# Patient Record
Sex: Female | Born: 1965
Health system: Southern US, Community
[De-identification: ages and names within clinical notes are randomized; demographics above are authoritative.]

## PROBLEM LIST (undated history)

## (undated) DIAGNOSIS — F32A Depression, unspecified: Secondary | ICD-10-CM

## (undated) DIAGNOSIS — F329 Major depressive disorder, single episode, unspecified: Secondary | ICD-10-CM

## (undated) DIAGNOSIS — K2970 Gastritis, unspecified, without bleeding: Secondary | ICD-10-CM

## (undated) DIAGNOSIS — E119 Type 2 diabetes mellitus without complications: Secondary | ICD-10-CM

## (undated) DIAGNOSIS — N809 Endometriosis, unspecified: Secondary | ICD-10-CM

## (undated) HISTORY — DX: Endometriosis, unspecified: N80.9

## (undated) HISTORY — DX: Depression, unspecified: F32.A

## (undated) HISTORY — PX: TONSILLECTOMY: SUR1361

## (undated) HISTORY — DX: Type 2 diabetes mellitus without complications: E11.9

## (undated) HISTORY — PX: REDUCTION MAMMAPLASTY: SUR839

## (undated) HISTORY — DX: Gastritis, unspecified, without bleeding: K29.70

## (undated) HISTORY — DX: Major depressive disorder, single episode, unspecified: F32.9

---

## 1994-02-12 HISTORY — PX: AUGMENTATION MAMMAPLASTY: SUR837

## 1998-02-26 ENCOUNTER — Encounter: Payer: Self-pay | Admitting: Emergency Medicine

## 1998-02-27 ENCOUNTER — Inpatient Hospital Stay (HOSPITAL_COMMUNITY): Admission: EM | Admit: 1998-02-27 | Discharge: 1998-02-28 | Payer: Self-pay | Admitting: Emergency Medicine

## 1998-02-27 ENCOUNTER — Encounter: Payer: Self-pay | Admitting: General Surgery

## 1998-02-27 ENCOUNTER — Encounter: Payer: Self-pay | Admitting: Family Medicine

## 1998-02-27 ENCOUNTER — Encounter: Payer: Self-pay | Admitting: Emergency Medicine

## 1998-03-09 ENCOUNTER — Ambulatory Visit (HOSPITAL_COMMUNITY): Admission: RE | Admit: 1998-03-09 | Discharge: 1998-03-09 | Payer: Self-pay | Admitting: Gastroenterology

## 1998-03-09 ENCOUNTER — Encounter: Payer: Self-pay | Admitting: Gastroenterology

## 1998-03-15 HISTORY — PX: ABDOMINAL HYSTERECTOMY: SHX81

## 1998-03-25 ENCOUNTER — Inpatient Hospital Stay (HOSPITAL_COMMUNITY): Admission: RE | Admit: 1998-03-25 | Discharge: 1998-03-28 | Payer: Self-pay | Admitting: Obstetrics and Gynecology

## 1998-03-30 ENCOUNTER — Inpatient Hospital Stay (HOSPITAL_COMMUNITY): Admission: AD | Admit: 1998-03-30 | Discharge: 1998-03-30 | Payer: Self-pay | Admitting: Gynecology

## 1998-04-15 ENCOUNTER — Encounter (HOSPITAL_COMMUNITY): Admission: RE | Admit: 1998-04-15 | Discharge: 1998-07-14 | Payer: Self-pay | Admitting: Obstetrics and Gynecology

## 1998-09-16 ENCOUNTER — Other Ambulatory Visit: Admission: RE | Admit: 1998-09-16 | Discharge: 1998-09-16 | Payer: Self-pay | Admitting: Obstetrics and Gynecology

## 2002-11-16 ENCOUNTER — Encounter: Admission: RE | Admit: 2002-11-16 | Discharge: 2003-02-14 | Payer: Self-pay | Admitting: Obstetrics and Gynecology

## 2005-09-18 ENCOUNTER — Encounter: Admission: RE | Admit: 2005-09-18 | Discharge: 2005-10-11 | Payer: Self-pay | Admitting: Family Medicine

## 2010-03-05 ENCOUNTER — Encounter: Payer: Self-pay | Admitting: Family Medicine

## 2011-09-25 ENCOUNTER — Emergency Department (HOSPITAL_COMMUNITY)
Admission: EM | Admit: 2011-09-25 | Discharge: 2011-09-25 | Disposition: A | Discharge status: Home / Self Care | Payer: BC Managed Care – PPO | Attending: Emergency Medicine | Admitting: Emergency Medicine

## 2011-09-25 ENCOUNTER — Encounter (HOSPITAL_COMMUNITY): Payer: Self-pay | Admitting: Emergency Medicine

## 2011-09-25 DIAGNOSIS — H60399 Other infective otitis externa, unspecified ear: Secondary | ICD-10-CM | POA: Insufficient documentation

## 2011-09-25 DIAGNOSIS — H6090 Unspecified otitis externa, unspecified ear: Secondary | ICD-10-CM

## 2011-09-25 MED ORDER — CIPROFLOXACIN-DEXAMETHASONE 0.3-0.1 % OT SUSP
4.0000 [drp] | Freq: Once | OTIC | Status: AC
  Administered 2011-09-25: 4 [drp] via OTIC
  Filled 2011-09-25: qty 7.5

## 2011-09-25 NOTE — ED Provider Notes (Signed)
History     CSN: [id number]  Arrival date & time 09/25/11  4098   First MD Initiated Contact with Patient 09/25/11 0450      Chief Complaint  Patient presents with  . Otalgia  . Headache   HPI  History provided by the patient. Patient is a 46 year old female with no significant PMH who presents with complaints of right ear pains for the past several days. Patient states that pain has increased significantly and is worse with any pressure or movement of her ear. Pain radiates to the whole side of the right head. Patient has been trying to use over-the-counter pain medications without significant relief. She denies having any drainage from the ear. She denies any hearing loss or ringing in the ear. She denies any nasal congestion, rhinorrhea or cough. She denies any fever, chills or sweats.    History reviewed. No pertinent past medical history.  Past Surgical History  Procedure Date  . Abdominal hysterectomy   . C section x 2     History reviewed. No pertinent family history.  History  Substance Use Topics  . Smoking status: Never Smoker   . Smokeless tobacco: Not on file  . Alcohol Use: No    OB History    Grav Para Term Preterm Abortions TAB SAB Ect Mult Living                  Review of Systems  Constitutional: Negative for fever and chills.  HENT: Positive for ear pain. Negative for hearing loss, congestion, sore throat, rhinorrhea and tinnitus.   Respiratory: Negative for cough.   Gastrointestinal: Negative for nausea and vomiting.    Allergies  Hydrocodone and Sulfa antibiotics  Home Medications   Current Outpatient Rx  Name Route Sig Dispense Refill  . AMPHETAMINE-DEXTROAMPHETAMINE 20 MG PO TABS Oral Take 20 mg by mouth 2 (two) times daily.    . BUPROPION HCL ER (XL) 300 MG PO TB24 Oral Take 300 mg by mouth daily.    . CYCLOBENZAPRINE HCL 10 MG PO TABS Oral Take 10 mg by mouth 3 (three) times daily as needed. Neck and shoulder pain    .  LEVONORGESTREL-ETHINYL ESTRAD 0.1-20 MG-MCG PO TABS Oral Take 1 tablet by mouth daily.    Marland Kitchen NABUMETONE 500 MG PO TABS Oral Take 500 mg by mouth 2 (two) times daily as needed. Ulcer    . TEMAZEPAM 30 MG PO CAPS Oral Take 30 mg by mouth at bedtime as needed.      BP 146/85  Pulse 88  Temp 98.2 F (36.8 C) (Oral)  Resp 16  SpO2 100%  Physical Exam  Nursing note and vitals reviewed. Constitutional: She is oriented to person, place, and time. She appears well-developed and well-nourished. No distress.  HENT:  Head: Normocephalic and atraumatic.       There is edema right ear canal making it difficult to visualize TM. No significant drainage noted. There is tenderness to the ear special with pressure over the tragus.  Cardiovascular: Normal rate and regular rhythm.   Pulmonary/Chest: Effort normal and breath sounds normal.  Lymphadenopathy:    She has no cervical adenopathy.  Neurological: She is alert and oriented to person, place, and time.  Skin: Skin is warm and dry. No rash noted.  Psychiatric: She has a normal mood and affect. Her behavior is normal.    ED Course  Procedures    1. Otitis externa       MDM  4:00  AM patient seen and evaluated. Exam consistent with otitis externa. Patient given Ciprodex drops to use at home.        Angus Seller, [place] 09/27/11 [phone]

## 2011-09-25 NOTE — ED Notes (Signed)
Pt states her right ear has been closing up and bothering her off and on for about a week  Pt states the right side of her neck feels swollen and it is causing shooting pain in the right side of her head   Pt states it has gotten much worse this evening

## 2011-09-27 NOTE — ED Provider Notes (Signed)
Medical screening examination/treatment/procedure(s) were performed by non-physician practitioner and as supervising physician I was immediately available for consultation/collaboration.  Olivia Mackie, MD 09/27/11 [phone]

## 2012-05-16 ENCOUNTER — Encounter: Payer: Self-pay | Admitting: Obstetrics and Gynecology

## 2012-05-21 ENCOUNTER — Ambulatory Visit: Payer: Self-pay | Admitting: Obstetrics and Gynecology

## 2012-09-03 ENCOUNTER — Encounter: Payer: Self-pay | Admitting: Obstetrics and Gynecology

## 2012-09-08 ENCOUNTER — Ambulatory Visit: Payer: Self-pay | Admitting: Obstetrics and Gynecology

## 2012-09-15 ENCOUNTER — Ambulatory Visit: Payer: Self-pay | Admitting: Obstetrics and Gynecology

## 2012-12-25 ENCOUNTER — Encounter: Payer: Self-pay | Admitting: Nurse Practitioner

## 2012-12-25 ENCOUNTER — Ambulatory Visit (INDEPENDENT_AMBULATORY_CARE_PROVIDER_SITE_OTHER): Payer: BC Managed Care – PPO | Admitting: Nurse Practitioner

## 2012-12-25 VITALS — BP 112/60 | HR 80 | Resp 16 | Ht 64.0 in | Wt 196.0 lb

## 2012-12-25 DIAGNOSIS — N809 Endometriosis, unspecified: Secondary | ICD-10-CM | POA: Insufficient documentation

## 2012-12-25 DIAGNOSIS — N83209 Unspecified ovarian cyst, unspecified side: Secondary | ICD-10-CM

## 2012-12-25 DIAGNOSIS — Z01419 Encounter for gynecological examination (general) (routine) without abnormal findings: Secondary | ICD-10-CM

## 2012-12-25 MED ORDER — FLUCONAZOLE 150 MG PO TABS: 150.0000 mg | ORAL_TABLET | Freq: Once | ORAL | Status: DC

## 2012-12-25 NOTE — Patient Instructions (Signed)

## 2012-12-25 NOTE — Progress Notes (Signed)
47 y.o. G2P2 Married Caucasian Fe here for annual exam.  Has been off OCP since January.  No pain but can tell when she ovulates. She is post TAH/ LSO with right OV cystectomy secondary to endometriosis.  She has had to take OCP to reduce ovarian cyst through the years.  Has tried several times to stop and always has recurrent pain.  At this time she has done well without pain.  Her last PUS 07/2008 with small cyst on right and maybe mass effect on left from endometriosis.   Every time off OCP pain was worse, except this time. Some vaso symptoms.  Sleep is OK.  No pelvic with SA.  No headaches.  Daughter (pt. here) got married last week.  Patient's last menstrual period was 03/15/1998.          Sexually active: yes  The current method of family planning is status post hysterectomy.    Exercising: no  The patient does not participate in regular exercise at present. Smoker:  no  Health Maintenance: Pap:  09/16/1998 MMG:  2/13 Colonoscopy:  none BMD:   none TDaP:  Not sure  Labs: PCP    reports that she has never smoked. She does not have any smokeless tobacco history on file. She reports that she does not drink alcohol or use illicit drugs.  Past Medical History  Diagnosis Date  . Depression     Past Surgical History  Procedure Laterality Date  . C section x 2    . Augmentation mammaplasty Bilateral 1996    breast lift  . Cesarean section      x 2  . Abdominal hysterectomy  03/1998    LSO,RT. Cystectomy    Current Outpatient Prescriptions  Medication Sig Dispense Refill  . cyclobenzaprine (FLEXERIL) 10 MG tablet Take 10 mg by mouth 3 (three) times daily as needed. Neck and shoulder pain      . nabumetone (RELAFEN) 500 MG tablet Take 500 mg by mouth 2 (two) times daily as needed. Ulcer      . temazepam (RESTORIL) 30 MG capsule Take 30 mg by mouth at bedtime as needed.      . fluconazole (DIFLUCAN) 150 MG tablet Take 1 tablet (150 mg total) by mouth once. Take one tablet.  Repeat in 48  hours if symptoms are not completely resolved.  2 tablet  0   No current facility-administered medications for this visit.    History reviewed. No pertinent family history.  ROS:  Pertinent items are noted in HPI.  Otherwise, a comprehensive ROS was negative.  Exam:   BP 112/60  Pulse 80  Resp 16  Ht 5\' 4"  (1.626 m)  Wt 196 lb (88.905 kg)  BMI 33.63 kg/m2  LMP 03/15/1998 Height: 5\' 4"  (162.6 cm)  Ht Readings from Last 3 Encounters:  12/25/12 5\' 4"  (1.626 m)  05/16/12 5' 3.5" (1.613 m)    General appearance: alert, cooperative and appears stated age Head: Normocephalic, without obvious abnormality, atraumatic Neck: no adenopathy, supple, symmetrical, trachea midline and thyroid normal to inspection and palpation Lungs: clear to auscultation bilaterally Breasts: normal appearance, no masses or tenderness Heart: regular rate and rhythm Abdomen: soft, non-tender; no masses,  no organomegaly Extremities: extremities normal, atraumatic, no cyanosis or edema Skin: Skin color, texture, turgor normal. No rashes or lesions Lymph nodes: Cervical, supraclavicular, and axillary nodes normal. No abnormal inguinal nodes palpated Neurologic: Grossly normal   Pelvic: External genitalia:  no lesions  Urethra:  normal appearing urethra with no masses, tenderness or lesions              Bartholin's and Skene's: normal                 Vagina: normal appearing vagina with normal color and some white discharge c/w yeast, no lesions              Cervix: absent              Pap taken: no Bimanual Exam:  Uterus:  uterus absent              Adnexa: no mass, fullness, tenderness               Rectovaginal: Confirms               Anus:  normal sphincter tone, no lesions  A:  Well Woman with normal exam  S/P TAH / LSO, right OV cystectomy secondary to endometriosis 03/1998  Last PUS 07/2008  History of depression  Yeast vaginitis  P:   Pap smear as per guidelines   Mammogram is due  now and will schedule  Will repeat PUS secondary to being off OCP to check ovarian cyst  Counseled on breast self exam, adequate intake of calcium and vitamin D, diet and exercise return annually or prn  An After Visit Summary was printed and given to the patient.

## 2012-12-26 ENCOUNTER — Telehealth: Payer: Self-pay | Admitting: Nurse Practitioner

## 2012-12-26 NOTE — Telephone Encounter (Signed)
LMTCB to discuss ins benefits and to schedule a PUS.

## 2012-12-28 NOTE — Progress Notes (Signed)
Encounter reviewed by Dr. Kenesha Moshier Silva.  

## 2012-12-30 NOTE — Telephone Encounter (Signed)
LMTCB to discuss ins benefits and schedule PUS.  °

## 2013-01-15 ENCOUNTER — Other Ambulatory Visit: Payer: BC Managed Care – PPO

## 2013-01-15 ENCOUNTER — Encounter: Payer: Self-pay | Admitting: Obstetrics and Gynecology

## 2013-01-15 ENCOUNTER — Ambulatory Visit (INDEPENDENT_AMBULATORY_CARE_PROVIDER_SITE_OTHER): Payer: BC Managed Care – PPO

## 2013-01-15 ENCOUNTER — Other Ambulatory Visit: Payer: BC Managed Care – PPO | Admitting: Obstetrics and Gynecology

## 2013-01-15 ENCOUNTER — Ambulatory Visit (INDEPENDENT_AMBULATORY_CARE_PROVIDER_SITE_OTHER): Payer: BC Managed Care – PPO | Admitting: Obstetrics and Gynecology

## 2013-01-15 VITALS — BP 122/88 | HR 88 | Ht 64.0 in | Wt 198.0 lb

## 2013-01-15 DIAGNOSIS — N83209 Unspecified ovarian cyst, unspecified side: Secondary | ICD-10-CM

## 2013-01-15 DIAGNOSIS — Z1239 Encounter for other screening for malignant neoplasm of breast: Secondary | ICD-10-CM

## 2013-01-15 DIAGNOSIS — N83201 Unspecified ovarian cyst, right side: Secondary | ICD-10-CM

## 2013-01-15 MED ORDER — NORETHIN-ETH ESTRAD-FE BIPHAS 1 MG-10 MCG / 10 MCG PO TABS: 1.0000 | ORAL_TABLET | Freq: Every day | ORAL | Status: DC

## 2013-01-15 NOTE — Progress Notes (Signed)
Subjective  GYNECOLOGY VISIT  PCP:   Referring provider:   HPI: 47 y.o.   Married  Caucasian  female   G2P2 with Patient's last menstrual period was 03/15/1998.   here for   Pelvic ultrasound due to pelvic pain and history of ovarian cysts. 15 years ago had total abdominal hysterectomy and left salpingo-oophorectomy and right ovarian cystectomy. Had Lupron therapy.  Has been ov oral contraceptives to control pain and ovarian cysts.  Does this off and on.  "Feels dead on oral contraceptives."  Does not feel well on them.  Gets depressed.  Unable to sleep when taking them.  Now off Alesse for 11 months.  Has never tried ultralow dose OCPs.  Patient states pain after her last pelvic exam.  When off OCPs has painful bowel movements.  Pain recently was more prominent when patient would stand up.  No hot flashes or night sweats.   Patient's last ultrasound was in 2010.  Saw 3.4 x 2.2 x 2.3 right ovary with several small lucent areas with low level echoes.  Left adnexal region with 9 x 7 x 8 mm area of possible endometriosis.    OB History   Grav Para Term Preterm Abortions TAB SAB Ect Mult Living   2 2        2        History reviewed. No pertinent family history.  Patient Active Problem List   Diagnosis Date Noted  . Endometriosis 12/25/2012   Past Medical History  Diagnosis Date  . Depression     Past Surgical History  Procedure Laterality Date  . C section x 2    . Augmentation mammaplasty Bilateral 1996    breast lift  . Cesarean section      x 2  . Abdominal hysterectomy  03/1998    LSO,RT. Cystectomy    ALLERGIES: Hydrocodone; Macrobid; and Sulfa antibiotics  Current Outpatient Prescriptions  Medication Sig Dispense Refill  . amphetamine-dextroamphetamine (ADDERALL) 20 MG tablet Take 1 tablet by mouth daily.      . cyclobenzaprine (FLEXERIL) 10 MG tablet Take 10 mg by mouth 3 (three) times daily as needed. Neck and shoulder pain      . nabumetone (RELAFEN)  500 MG tablet Take 500 mg by mouth 2 (two) times daily as needed. Ulcer      . temazepam (RESTORIL) 30 MG capsule Take 30 mg by mouth at bedtime as needed.       No current facility-administered medications for this visit.     ROS:  Pertinent items are noted in HPI.   PHYSICAL EXAMINATION:    BP 122/88  Pulse 88  Ht 5\' 4"  (1.626 m)  Wt 198 lb (89.812 kg)  BMI 33.97 kg/m2  LMP 03/15/1998   Wt Readings from Last 3 Encounters:  01/15/13 198 lb (89.812 kg)  12/25/12 196 lb (88.905 kg)  05/16/12 192 lb (87.091 kg)     Ht Readings from Last 3 Encounters:  01/15/13 5\' 4"  (1.626 m)  12/25/12 5\' 4"  (1.626 m)  05/16/12 5' 3.5" (1.613 m)    General appearance: alert, cooperative and appears stated age   Neurologic: Grossly normal  See ultrasound below - 5 cm right ovarian cysts with echoes and low level debris.  No left adnexal mass.  Uterus absent.  Cuff normal.  No free fluid.  ASSESSMENT  Pelvic pain.   Status post total abdominal hysterectomy and left salpingo-oophorectomy and right ovarian cystectomy for endometriosis. Right ovarian cysts.  Looks like  possible hemorrhagic cyst.  PLAN  Discussion with patient regarding options for care including ultralow dose combined oral contraceptives versus robotic laparoscopic right salpingo-oophorectomy with lysis of adhesions and treatment of potential endometriosis.  Risks and benefits of each discussed.   Laparoscopy risks include but are not limited to bleeding, infections, damage to surrounding organs, DVT, PE, death, reaction to anesthesia, need for laparotomy to complete the procedure, menopausal symptoms and potential need for estrogen hormone therapy. Risks of thromboembolic events reviewed if takes combined OCPs.  Risks of current malaise while on OCPs also reviewed.  Patient elects to try LoLoestrin.  See Epic orders.   Follow up in 6 weeks for repeat ultrasound and recheck.   35 minutes face to face time fo which over 50%  was spent in counseling.   After Visit Summary was printed and given to the patient.    Ultrasound report:

## 2013-01-15 NOTE — Patient Instructions (Addendum)
Ethinyl Estradiol; Norethindrone Acetate tablets (contraception) What is this medicine? ETHINYL ESTRADIOL; NORETHINDRONE ACETATE (ETH in il es tra DYE ole; nor eth IN drone AS e tate) is an oral contraceptive. The products combine two types of female hormones, an estrogen and a progestin. They are used to prevent ovulation and pregnancy. This medicine may be used for other purposes; ask your health care provider or pharmacist if you have questions. COMMON BRAND NAME(S): Estrostep Fe, Gildess Fe 1.5/30, Gildess Fe 1/20, Gildess, Junel 1.5/30, Junel 1/20, Junel Fe 1.5/30, Junel Fe 1/20, Larin Fe, Granite, Lo Loestrin Fe, Loestrin 1.5/30, Loestrin 1/20, Loestrin 24 Fe, Loestrin FE 1.5/30, Loestrin FE 1/20, Lomedia 24 Fe, Microgestin 1.5/30, Microgestin 1/20, Microgestin Fe 1.5/30, Microgestin Fe 1/20, Tilia Fe, Tri-Legest Fe What should I tell my health care provider before I take this medicine? They need to know if you have or ever had any of these conditions: -abnormal vaginal bleeding -blood vessel disease or blood clots -breast, cervical, endometrial, ovarian, liver, or uterine cancer -diabetes -gallbladder disease -heart disease or recent heart attack -high blood pressure -high cholesterol -kidney disease -liver disease -migraine headaches -stroke -systemic lupus erythematosus (SLE) -tobacco smoker -an unusual or allergic reaction to estrogens, progestins, other medicines, foods, dyes, or preservatives -pregnant or trying to get pregnant -breast-feeding How should I use this medicine? Take this medicine by mouth. To reduce nausea, this medicine may be taken with food. Follow the directions on the prescription label. Take this medicine at the same time each day and in the order directed on the package. Do not take your medicine more often than directed. Contact your pediatrician regarding the use of this medicine in children. Special care may be needed. This medicine has been used in female  children who have started having menstrual periods. A patient package insert for the product will be given with each prescription and refill. Read this sheet carefully each time. The sheet may change frequently. Overdosage: If you think you have taken too much of this medicine contact a poison control center or emergency room at once. NOTE: This medicine is only for you. Do not share this medicine with others. What if I miss a dose? If you miss a dose, refer to the patient information sheet you received with your medicine for direction. If you miss more than one pill, this medicine may not be as effective and you may need to use another form of birth control. What may interact with this medicine? -acetaminophen -antibiotics or medicines for infections, especially rifampin, rifabutin, rifapentine, and griseofulvin, and possibly penicillins or tetracyclines -aprepitant -ascorbic acid (vitamin C) -atorvastatin -barbiturate medicines, such as phenobarbital -bosentan -carbamazepine -caffeine -clofibrate -cyclosporine -dantrolene -doxercalciferol -felbamate -grapefruit juice -hydrocortisone -medicines for anxiety or sleeping problems, such as diazepam or temazepam -medicines for diabetes, including pioglitazone -mineral oil -modafinil -mycophenolate -nefazodone -oxcarbazepine -phenytoin -prednisolone -ritonavir or other medicines for HIV infection or AIDS -rosuvastatin -selegiline -soy isoflavones supplements -St. John's wort -tamoxifen or raloxifene -theophylline -thyroid hormones -topiramate -warfarin This list may not describe all possible interactions. Give your health care provider a list of all the medicines, herbs, non-prescription drugs, or dietary supplements you use. Also tell them if you smoke, drink alcohol, or use illegal drugs. Some items may interact with your medicine. What should I watch for while using this medicine? Visit your doctor or health care  professional for regular checks on your progress. You will need a regular breast and pelvic exam and Pap smear while on this medicine. Use an additional  method of contraception during the first cycle that you take these tablets. If you have any reason to think you are pregnant, stop taking this medicine right away and contact your doctor or health care professional. If you are taking this medicine for hormone related problems, it may take several cycles of use to see improvement in your condition. Smoking increases the risk of getting a blood clot or having a stroke while you are taking birth control pills, especially if you are more than 46 years old. You are strongly advised not to smoke. This medicine can make your body retain fluid, making your fingers, hands, or ankles swell. Your blood pressure can go up. Contact your doctor or health care professional if you feel you are retaining fluid. This medicine can make you more sensitive to the sun. Keep out of the sun. If you cannot avoid being in the sun, wear protective clothing and use sunscreen. Do not use sun lamps or tanning beds/booths. If you wear contact lenses and notice visual changes, or if the lenses begin to feel uncomfortable, consult your eye care specialist. In some women, tenderness, swelling, or minor bleeding of the gums may occur. Notify your dentist if this happens. Brushing and flossing your teeth regularly may help limit this. See your dentist regularly and inform your dentist of the medicines you are taking. If you are going to have elective surgery, you may need to stop taking this medicine before the surgery. Consult your health care professional for advice. This medicine does not protect you against HIV infection (AIDS) or any other sexually transmitted diseases. What side effects may I notice from receiving this medicine? Side effects that you should report to your doctor or health care professional as soon as  possible: -breast tissue changes or discharge -changes in vaginal bleeding during your period or between your periods -chest pain -coughing up blood -dizziness or fainting spells -headaches or migraines -leg, arm or groin pain -severe or sudden headaches -stomach pain (severe) -sudden shortness of breath -sudden loss of coordination, especially on one side of the body -speech problems -symptoms of vaginal infection like itching, irritation or unusual discharge -tenderness in the upper abdomen -vomiting -weakness or numbness in the arms or legs, especially on one side of the body -yellowing of the eyes or skin Side effects that usually do not require medical attention (report to your doctor or health care professional if they continue or are bothersome): -breakthrough bleeding and spotting that continues beyond the 3 initial cycles of pills -breast tenderness -mood changes, anxiety, depression, frustration, anger, or emotional outbursts -increased sensitivity to sun or ultraviolet light -nausea -skin rash, acne, or brown spots on the skin -weight gain (slight) This list may not describe all possible side effects. Call your doctor for medical advice about side effects. You may report side effects to FDA at 1-800-FDA-1088. Where should I keep my medicine? Keep out of the reach of children. Store at room temperature between 15 and 30 degrees C (59 and 86 degrees F). Throw away any unused medicine after the expiration date. NOTE: This sheet is a summary. It may not cover all possible information. If you have questions about this medicine, talk to your doctor, pharmacist, or health care provider.  2014, [employer]. (2012-06-06 15:35:20)   The robot is called DaVinci.

## 2013-01-16 ENCOUNTER — Telehealth: Payer: Self-pay | Admitting: Obstetrics and Gynecology

## 2013-01-16 NOTE — Telephone Encounter (Signed)
LMTCB to schedule repeat scan.

## 2013-01-22 NOTE — Telephone Encounter (Signed)
LMTCB

## 2013-01-29 ENCOUNTER — Telehealth: Payer: Self-pay | Admitting: Obstetrics and Gynecology

## 2013-01-29 NOTE — Telephone Encounter (Signed)
Pt calling to schedule an ultrasound appointment. °

## 2013-01-29 NOTE — Telephone Encounter (Signed)
LMTCB to schedule PUS.  °

## 2013-02-26 ENCOUNTER — Ambulatory Visit
Admission: RE | Admit: 2013-02-26 | Discharge: 2013-02-26 | Disposition: A | Discharge status: Home / Self Care | Payer: BC Managed Care – PPO | Source: Ambulatory Visit | Attending: Obstetrics and Gynecology | Admitting: Obstetrics and Gynecology

## 2013-02-26 DIAGNOSIS — Z1239 Encounter for other screening for malignant neoplasm of breast: Secondary | ICD-10-CM

## 2013-03-05 ENCOUNTER — Encounter: Payer: Self-pay | Admitting: Obstetrics and Gynecology

## 2013-03-05 ENCOUNTER — Ambulatory Visit (INDEPENDENT_AMBULATORY_CARE_PROVIDER_SITE_OTHER): Payer: BC Managed Care – PPO

## 2013-03-05 ENCOUNTER — Other Ambulatory Visit: Payer: BC Managed Care – PPO

## 2013-03-05 ENCOUNTER — Ambulatory Visit (INDEPENDENT_AMBULATORY_CARE_PROVIDER_SITE_OTHER): Payer: BC Managed Care – PPO | Admitting: Obstetrics and Gynecology

## 2013-03-05 VITALS — BP 132/80 | HR 74 | Resp 14 | Wt 201.0 lb

## 2013-03-05 DIAGNOSIS — N83201 Unspecified ovarian cyst, right side: Secondary | ICD-10-CM

## 2013-03-05 DIAGNOSIS — N83209 Unspecified ovarian cyst, unspecified side: Secondary | ICD-10-CM

## 2013-03-05 DIAGNOSIS — N83299 Other ovarian cyst, unspecified side: Secondary | ICD-10-CM

## 2013-03-05 MED ORDER — LEVONORGESTREL-ETHINYL ESTRAD 0.1-20 MG-MCG PO TABS: 1.0000 | ORAL_TABLET | Freq: Every day | ORAL | Status: DC

## 2013-03-05 NOTE — Patient Instructions (Signed)
Call if you develop increased pain or would like to discuss surgery further.

## 2013-03-05 NOTE — Progress Notes (Signed)
Please note that this is the patient's ultrasound from December, 2014 documenting the right ovarian cyst.

## 2013-03-05 NOTE — Progress Notes (Signed)
Patient ID: Erica Dennis, female   DOB: Apr 27, 1965, 48 y.o.   MRN: [id number] 48 y.o.   Married  Caucasian  female    G2P2 with Patient's last menstrual period was 03/15/1998.    here for   Pelvic ultrasound follow up for a 5 cm right ovarian cyst with echoes noted on 01/23/13. Status post TAH/LSO and right ovarian cystectomy for pelvic pain and ovarian cysts. Patient has been on Alesse generic OCPs in past and switched to LoLoestrin with goal to find an OCP with less side effects. Had mood swings and bilateral leg pain on LoLoEstrin so switched back to Alesse pack she had at home. Now feels lack of energy, which is usual for her when on OCPs.  Has been on multiple medications for depression and general fatigue.  Has insomnia. Stopped Adderall and Wellbutrin. Had hair loss on Adderall. Tried lLxapro and Cymbalta.  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  Objective  Ultrasound today - Normal right ovary with 1.2 cm follicle.  Absent uterus and left ovary.  Assessment  Status post TAH/LSO, and right ovarian cystectomy. Intermittent ovarian cyst formation on the right.  None today. On Alesse and stable. Depression/decreased energy and difficulty finding a good combination of medications to treat.  Plan  Continue on Alesse generic 4 packs and 3 refills.  Declines laparoscopy at this time.  Risks and benefits again reviewed. Will pursue consultation with Dr. Nolen MuMcKinney to discuss medication for depression and decreased energy. Follow up prn.   25 minutes face to face time of which over 50% was spent in counseling.   After visit summary to the patient.

## 2013-06-15 ENCOUNTER — Ambulatory Visit (INDEPENDENT_AMBULATORY_CARE_PROVIDER_SITE_OTHER): Payer: BC Managed Care – PPO | Admitting: Obstetrics and Gynecology

## 2013-06-15 ENCOUNTER — Encounter: Payer: Self-pay | Admitting: Obstetrics and Gynecology

## 2013-06-15 VITALS — BP 130/80 | HR 82 | Resp 16 | Wt 208.4 lb

## 2013-06-15 DIAGNOSIS — N951 Menopausal and female climacteric states: Secondary | ICD-10-CM

## 2013-06-15 DIAGNOSIS — R232 Flushing: Secondary | ICD-10-CM

## 2013-06-15 DIAGNOSIS — R1031 Right lower quadrant pain: Secondary | ICD-10-CM

## 2013-06-15 NOTE — Progress Notes (Signed)
Patient ID: Erica Dennis, female   DOB: [birth year], 48 y.o.   MRN: [id number] GYNECOLOGY  VISIT   HPI: 48 y.o.  G2P2 Married  Caucasian  female   G2P2 with Patient's last menstrual period was 03/15/1998.   here for discussion of monthly RLQ pain and consideration of surgery for ovarian removal.   Pelvic ultrasound follow up for a 5 cm right ovarian cyst with echoes noted on 01/23/13.  Repeat ultrasound 03/06/13 showed normal right ovary.   Status post TAH/LSO and right ovarian cystectomy for pelvic pain and ovarian cysts.  Had adhesive disease and endometriosis.  Patient has been on Alesse generic OCPs in past and switched to LoLoestrin with goal to find an OCP with less side effects.   Had mood swings and bilateral leg pain on LoLoEstrin so switched back to Alesse pack she had at home.  Felt lack of energy, which is usual for her when on OCPs.  Stopped oral contraceptives on Easter Sunday.   21 days later developed right lower quadrant pain.  Pain only occurs when off OCPs and occurs monthly.  Had hot flashes while on OCPs.  Patient is considering surgery for ovarian removal and wants to discuss this and post op hormone therapy.   GYNECOLOGIC HISTORY: Patient's last menstrual period was 03/15/1998.    OB History   Grav Para Term Preterm Abortions TAB SAB Ect Mult Living   2 2        2          Patient Active Problem List   Diagnosis Date Noted  . Endometriosis 12/25/2012    Past Medical History  Diagnosis Date  . Depression     Past Surgical History  Procedure Laterality Date  . C section x 2    . Augmentation mammaplasty Bilateral 1996    breast lift  . Cesarean section      x 2  . Abdominal hysterectomy  03/1998    LSO,RT. Cystectomy    Current Outpatient Prescriptions  Medication Sig Dispense Refill  . nabumetone (RELAFEN) 500 MG tablet Take 500 mg by mouth 2 (two) times daily as needed. Ulcer      . temazepam (RESTORIL) 30 MG capsule Take 30 mg by mouth  at bedtime as needed.      Marland Kitchen. amphetamine-dextroamphetamine (ADDERALL) 20 MG tablet Take 1 tablet by mouth daily.      . cyclobenzaprine (FLEXERIL) 10 MG tablet Take 10 mg by mouth 3 (three) times daily as needed. Neck and shoulder pain      . levonorgestrel-ethinyl estradiol (AVIANE,ALESSE,LESSINA) 0.1-20 MG-MCG tablet Take 1 tablet by mouth daily.  4 Package  3   No current facility-administered medications for this visit.     ALLERGIES: Hydrocodone; Macrobid; and Sulfa antibiotics  History reviewed. No pertinent family history.  History   Social History  . Marital Status: Married    Spouse Name: N/A    Number of Children: N/A  . Years of Education: N/A   Occupational History  . Not on file.   Social History Main Topics  . Smoking status: Never Smoker   . Smokeless tobacco: Not on file  . Alcohol Use: No  . Drug Use: No  . Sexual Activity: Yes    Partners: Male    Birth Control/ Protection: Surgical     Comment: Hysterectomy   Other Topics Concern  . Not on file   Social History Narrative  . No narrative on file    ROS:  Pertinent  items are noted in HPI.  PHYSICAL EXAMINATION:    BP 130/80  Pulse 82  Resp 16  Wt 208 lb 6.4 oz (94.53 kg)  LMP 03/15/1998     General appearance: alert, cooperative and appears stated age   ASSESSMENT  Status post TAH/LSO. Cyclic RLQ pain - presumed ovulatory pain.  History of ovarian cysts. Intolerance to oral contraceptives. Hot flashes.   PLAN  Will check [facility]. Return for pelvic ultrasound.  I discussed laparoscopy with robotic right salpingo-oophorectomy and possible laparotomy with right salpingo-oophorectomy for treatment of chronic pain.  Patient understands that there are risks of bleeding, infection, damage to surrounding organs, reactions to anesthesia, pneumonia, DVT, PE, death, and continued pain after ovarian removal, and potential need for hormone therapy with a delay in approximately three months before  beginning this menopausal treatment if endometriosis is detected. Patient would need to do a bowel prep the day before surgery. Further discussion will follow on the the day of the patient's ultrasound.  40 minutes face to face time of which 100% was spent in counseling.    An After Visit Summary was printed and given to the patient.

## 2013-06-15 NOTE — Patient Instructions (Signed)
Bilateral Salpingo-Oophorectomy Bilateral salpingo-oophorectomy is the surgical removal of both fallopian tubes and both ovaries. The ovaries are Wismer organs that produce eggs in women. The fallopian tubes transport the egg from the ovary to the womb (uterus). Usually, when this surgery is done, the uterus was previously removed. A bilateral salpingo-oophorectomy may be done to treat cancer or to reduce the risk of cancer in women who are at high risk. Removing both fallopian tubes and both ovaries will make you unable to become pregnant (sterile). It will also put you into menopause so that you will no longer have menstrual periods and may have menopausal symptoms such as hot flashes, night sweats, and mood changes. It will not affect your sex drive. LET [facility] CARE PROVIDER KNOW ABOUT:  Any allergies you have.  All medicines you are taking, including vitamins, herbs, eye drops, creams, and over-the-counter medicines.  Previous problems you or members of your family have had with the use of anesthetics.  Any blood disorders you have.  Previous surgeries you have had.  Medical conditions you have. RISKS AND COMPLICATIONS Generally, this is a safe procedure. However, as with any procedure, complications can occur. Possible complications include:  Injury to surrounding organs.  Bleeding.  Infection.  Blood clots in the legs or lungs.  Problems related to anesthesia. BEFORE THE PROCEDURE  Ask your health care provider about changing or stopping your regular medicines. You may need to stop taking certain medicines, such as aspirin or blood thinners, at least 1 week before the surgery.  Do not eat or drink anything for at least 8 hours before the surgery.  If you smoke, do not smoke for at least 2 weeks before the surgery.  Make plans to have someone drive you home after the procedure or after your hospital stay. Also arrange for someone to help you with activities during  recovery. PROCEDURE   You will be given medicine to help you relax before the procedure (sedative). You will then be given medicine to make you sleep through the procedure (general anesthetic). These medicines will be given through an IV access tube that is put into one of your veins.  Once you are asleep, your lower abdomen will be shaved and cleaned. A thin, flexible tube (catheter) will be placed in your bladder.  The surgeon may use a laparoscopic, robotic, or open technique for this surgery:  In the laparoscopic technique, the surgery is done through two Regnier cuts (incisions) in the abdomen. A thin, lighted tube with a tiny camera on the end (laparoscope) is inserted into one of the incisions. The tools needed for the procedure are put through the other incision.  A robotic technique may be chosen to perform complex surgery in a Yaw space. In the robotic technique, Ficek incisions will be made. A camera and surgical instruments are passed through the incisions. Surgical instruments will be controlled with the help of a robotic arm.  In the open technique, the surgery is done through one large incision in the abdomen.  Using any of these techniques, the surgeon removes the fallopian tubes and ovaries. The blood vessels will be clamped and tied.  The surgeon then uses staples or stitches to close the incision or incisions. AFTER THE PROCEDURE  You will be taken to a recovery area where you will be monitored for 1 to 3 hours. Your blood pressure, pulse, and temperature will be checked often. You will remain in the recovery area until you are stable and waking  up.  If the laparoscopic technique was used, you may be allowed to go home after several hours. You may have some shoulder pain after the laparoscopic procedure. This is normal and usually goes away in a day or two.  If the open technique was used, you will be admitted to the hospital for a couple of days.  You will be given pain  medicine as needed.  The IV access tube and catheter will be removed before you are discharged. Document Released: 01/29/2005 Document Revised: 10/01/2012 Document Reviewed: 07/23/2012 [facility] Patient Information 2014 [place]Care, [place].  Diagnostic Laparoscopy Laparoscopy is a surgical procedure. It is used to diagnose and treat diseases inside the belly (abdomen). It is usually a brief, common, and relatively simple procedure. The laparoscopeis a thin, lighted, pencil-sized instrument. It is like a telescope. It is inserted into your abdomen through a small cut (incision). Your caregiver can look at the organs inside your body through this instrument. He or she can see if there is anything abnormal. Laparoscopy can be done either in a hospital or outpatient clinic. You may be given a mild sedative to help you relax before the procedure. Once in the operating room, you will be given a drug to make you sleep (general anesthesia). Laparoscopy usually lasts less than 1 hour. After the procedure, you will be monitored in a recovery area until you are stable and doing well. Once you are home, it will take 2 to 3 days to fully recover. RISKS AND COMPLICATIONS  Laparoscopy has relatively few risks. Your caregiver will discuss the risks with you before the procedure. Some problems that can occur include:  Infection.  Bleeding.  Damage to other organs.  Anesthetic side effects. PROCEDURE Once you receive anesthesia, your surgeon inflates the abdomen with a harmless gas (carbon dioxide). This makes the organs easier to see. The laparoscope is inserted into the abdomen through a small incision. This allows your surgeon to see into the abdomen. Other small instruments are also inserted into the abdomen through other small openings. Many surgeons attach a video camera to the laparoscope to enlarge the view. During a diagnostic laparoscopy, the surgeon may be looking for inflammation, infection, or cancer.  Your surgeon may take tissue samples(biopsies). The samples are sent to a specialist in looking at cells and tissue samples (pathologist). The pathologist examines them under a microscope. Biopsies can help to diagnose or confirm a disease. AFTER THE PROCEDURE   The gas is released from inside the abdomen.  The incisions are closed with stitches (sutures). Because these incisions are small (usually less than 1/2 inch), there is usually minimal discomfort after the procedure. There may be some mild discomfort in the throat. This is from the tube placed in the throat while you were sleeping. You may have some mild abdominal discomfort. There may also be discomfort from the instrument placement incisions in the abdomen.  The recovery time is shortened as long as there are no complications.  You will rest in a recovery room until stable and doing well. As long as there are no complications, you may be allowed to go home. FINDING OUT THE RESULTS OF YOUR TEST Not all test results are available during your visit. If your test results are not back during the visit, make an appointment with your caregiver to find out the results. Do not assume everything is normal if you have not heard from your caregiver or the medical facility. It is important for you to follow up on  all of your test results. HOME CARE INSTRUCTIONS   Take all medicines as directed.  Only take over-the-counter or prescription medicines for pain, discomfort, or fever as directed by your caregiver.  Resume daily activities as directed.  Showers are preferred over baths.  You may resume sexual activities in 1 week or as directed.  Do not drive while taking narcotics. SEEK MEDICAL CARE IF:   There is increasing abdominal pain.  There is new pain in the shoulders (shoulder strap areas).  You feel lightheaded or faint.  You have the chills.  You or your child has an oral temperature above 102 F (38.9 C).  There is pus-like  (purulent) drainage from any of the wounds.  You are unable to pass gas or have a bowel movement.  You feel sick to your stomach (nauseous) or throw up (vomit). MAKE SURE YOU:   Understand these instructions.  Will watch your condition.  Will get help right away if you are not doing well or get worse. Document Released: 05/07/2000 Document Revised: 05/26/2012 Document Reviewed: 01/29/2007 [facility] CenterExitCare Patient Information 2014 [place]Exit[place], [place].

## 2013-06-16 LAB — FOLLICLE STIMULATING HORMONE: FSH: 5.1 m[IU]/mL

## 2013-06-17 ENCOUNTER — Telehealth: Payer: Self-pay | Admitting: Obstetrics and Gynecology

## 2013-06-17 NOTE — Telephone Encounter (Signed)
Left message for patient to call back. Need to go over benefits and schedule PUS °

## 2013-06-25 NOTE — Telephone Encounter (Signed)
Left message for patient to call back. Need to go over benefits and schedule PUS °

## 2013-07-01 NOTE — Telephone Encounter (Signed)
Spoke with patient. Advised that per benefit quote received, she will be responsible for $15 copay when she comes in for PUS. Patient agreeable. Scheduled PUS. Advised patient of 72 hour cancellation policy and $100 cancellation fee. Patient agreeable.  Mailed the In-Office procedure form that includes appointment date and time, patient copay, and cancellation policy.

## 2013-07-16 ENCOUNTER — Ambulatory Visit (INDEPENDENT_AMBULATORY_CARE_PROVIDER_SITE_OTHER): Payer: BC Managed Care – PPO

## 2013-07-16 ENCOUNTER — Encounter: Payer: Self-pay | Admitting: Obstetrics and Gynecology

## 2013-07-16 ENCOUNTER — Ambulatory Visit (INDEPENDENT_AMBULATORY_CARE_PROVIDER_SITE_OTHER): Payer: BC Managed Care – PPO | Admitting: Obstetrics and Gynecology

## 2013-07-16 VITALS — BP 128/82 | HR 80 | Ht 64.0 in | Wt 209.0 lb

## 2013-07-16 DIAGNOSIS — R1031 Right lower quadrant pain: Secondary | ICD-10-CM

## 2013-07-16 DIAGNOSIS — N83209 Unspecified ovarian cyst, unspecified side: Secondary | ICD-10-CM

## 2013-07-16 NOTE — Progress Notes (Signed)
48 y.o. G2P2 Married Caucasian female  G2P2 with Patient's last menstrual period was 03/15/1998.  Here for pelvic ultrasound to check right ovary.  Patient has a history of ovarian cysts and endometriosis and is needing clarification regarding the type of cysts she currently has.  She is pain free.   Pelvic ultrasound follow up for a 5 cm right ovarian cyst with echoes noted on 01/23/13.  Repeat ultrasound 03/06/13 showed normal right ovary.   Status post TAH/LSO and right ovarian cystectomy for pelvic pain and ovarian cysts. Had adhesive disease and endometriosis.   Patient feels better when she is off combined oral contraceptives.  Feels like "the sun starts to shine when she is off birth control." Had leg discomfort when was on LoLoEstrin the last time.   Objective - Report and images reviewed personally with patient.   Absent uterus and left ovary.  Hemorrhagic right ovarian cyst 44 x 38 x 33 mm with single septum 2 - 3 mm, avascular and echofree. No free fluid.     Assessment  Hemorrhagic right ovarian cyst.  History of endometriosis.  Status post TAH/LSO/right ovarian cystectomy - endometriosis in 2000.  Plan  I explained ovarian cysts to patient and how when she ovulates every month, she will form a cyst.  I told her that there is no evidence of endometriosis on the ultrasound today and that there can be small lesions missed by the ultrasound or any imaging technique.  I explained that endometriomas do not resolved with medical therapy such as oral contraceptives.  They are treated surgically with excision.   She understands that ovarian function is what feeds endometriosis.  Patient would like to be followed clinically right now, and I agree that this is a reasonable approach.  She knows to watch for signs of worsening pain, which could indicate torsion.  She declines [employer] and accepts educational materials about this.  25 minutes face to face time of which over 50%  was spent in counseling.   After visit summary to patient.

## 2013-07-16 NOTE — Patient Instructions (Addendum)
Norethindrone tablets (contraception) What is this medicine? NORETHINDRONE (nor eth IN drone) is an oral contraceptive. The product contains a female hormone known as a progestin. It is used to prevent pregnancy. This medicine may be used for other purposes; ask your health care provider or pharmacist if you have questions. COMMON BRAND NAME(S): Camila, Errin , Heather, Jencycla, Jolivette , Lyza, Nor-QD, Nora-BE, Ortho Micronor What should I tell my health care provider before I take this medicine? They need to know if you have any of these conditions: -blood vessel disease or blood clots -breast, cervical, or vaginal cancer -diabetes -heart disease -kidney disease -liver disease -mental depression -migraine -seizures -stroke -vaginal bleeding -an unusual or allergic reaction to norethindrone, other medicines, foods, dyes, or preservatives -pregnant or trying to get pregnant -breast-feeding How should I use this medicine? Take this medicine by mouth with a glass of water. You may take it with or without food. Follow the directions on the prescription label. Take this medicine at the same time each day and in the order directed on the package. Do not take your medicine more often than directed. Contact your pediatrician regarding the use of this medicine in children. Special care may be needed. This medicine has been used in female children who have started having menstrual periods. A patient package insert for the product will be given with each prescription and refill. Read this sheet carefully each time. The sheet may change frequently. Overdosage: If you think you have taken too much of this medicine contact a poison control center or emergency room at once. NOTE: This medicine is only for you. Do not share this medicine with others. What if I miss a dose? Try not to miss a dose. Every time you miss a dose or take a dose late your chance of pregnancy increases. When 1 pill is missed  (even if only 3 hours late), take the missed pill as soon as possible and continue taking a pill each day at the regular time (use a back up method of birth control for the next 48 hours). If more than 1 dose is missed, use an additional birth control method for the rest of your pill pack until menses occurs. Contact your health care professional if more than 1 dose has been missed. What may interact with this medicine? Do not take this medicine with any of the following medications: -amprenavir or fosamprenavir -bosentan This medicine may also interact with the following medications: -antibiotics or medicines for infections, especially rifampin, rifabutin, rifapentine, and griseofulvin, and possibly penicillins or tetracyclines -aprepitant -barbiturate medicines, such as phenobarbital -carbamazepine -felbamate -modafinil -oxcarbazepine -phenytoin -ritonavir or other medicines for HIV infection or AIDS -St. John's wort -topiramate This list may not describe all possible interactions. Give your health care provider a list of all the medicines, herbs, non-prescription drugs, or dietary supplements you use. Also tell them if you smoke, drink alcohol, or use illegal drugs. Some items may interact with your medicine. What should I watch for while using this medicine? Visit your doctor or health care professional for regular checks on your progress. You will need a regular breast and pelvic exam and Pap smear while on this medicine. Use an additional method of birth control during the first cycle that you take these tablets. If you have any reason to think you are pregnant, stop taking this medicine right away and contact your doctor or health care professional. If you are taking this medicine for hormone related problems, it may take several   cycles of use to see improvement in your condition. This medicine does not protect you against HIV infection (AIDS) or any other sexually transmitted  diseases. What side effects may I notice from receiving this medicine? Side effects that you should report to your doctor or health care professional as soon as possible: -breast tenderness or discharge -pain in the abdomen, chest, groin or leg -severe headache -skin rash, itching, or hives -sudden shortness of breath -unusually weak or tired -vision or speech problems -yellowing of skin or eyes Side effects that usually do not require medical attention (report to your doctor or health care professional if they continue or are bothersome): -changes in sexual desire -change in menstrual flow -facial hair growth -fluid retention and swelling -headache -irritability -nausea -weight gain or loss This list may not describe all possible side effects. Call your doctor for medical advice about side effects. You may report side effects to FDA at 1-800-FDA-1088. Where should I keep my medicine? Keep out of the reach of children. Store at room temperature between 15 and 30 degrees C (59 and 86 degrees F). Throw away any unused medicine after the expiration date. NOTE: This sheet is a summary. It may not cover all possible information. If you have questions about this medicine, talk to your doctor, pharmacist, or health care provider.  2014, Elsevier/Gold Standard. (2011-10-19 16:41:35)  Ovarian Cyst An ovarian cyst is a fluid-filled sac that forms on an ovary. The ovaries are small organs that produce eggs in women. Various types of cysts can form on the ovaries. Most are not cancerous. Many do not cause problems, and they often go away on their own. Some may cause symptoms and require treatment. Common types of ovarian cysts include:  Functional cysts These cysts may occur every month during the menstrual cycle. This is normal. The cysts usually go away with the next menstrual cycle if the woman does not get pregnant. Usually, there are no symptoms with a functional cyst.  Endometrioma cysts  These cysts form from the tissue that lines the uterus. They are also called "chocolate cysts" because they become filled with blood that turns brown. This type of cyst can cause pain in the lower abdomen during intercourse and with your menstrual period.  Cystadenoma cysts This type develops from the cells on the outside of the ovary. These cysts can get very big and cause lower abdomen pain and pain with intercourse. This type of cyst can twist on itself, cut off its blood supply, and cause severe pain. It can also easily rupture and cause a lot of pain.  Dermoid cysts This type of cyst is sometimes found in both ovaries. These cysts may contain different kinds of body tissue, such as skin, teeth, hair, or cartilage. They usually do not cause symptoms unless they get very big.  Theca lutein cysts These cysts occur when too much of a certain hormone (human chorionic gonadotropin) is produced and overstimulates the ovaries to produce an egg. This is most common after procedures used to assist with the conception of a baby (in vitro fertilization). CAUSES   Fertility drugs can cause a condition in which multiple large cysts are formed on the ovaries. This is called ovarian hyperstimulation syndrome.  A condition called polycystic ovary syndrome can cause hormonal imbalances that can lead to nonfunctional ovarian cysts. SIGNS AND SYMPTOMS  Many ovarian cysts do not cause symptoms. If symptoms are present, they may include:  Pelvic pain or pressure.  Pain in the lower  abdomen.  Pain during sexual intercourse.  Increasing girth (swelling) of the abdomen.  Abnormal menstrual periods.  Increasing pain with menstrual periods.  Stopping having menstrual periods without being pregnant. DIAGNOSIS  These cysts are commonly found during a routine or annual pelvic exam. Tests may be ordered to find out more about the cyst. These tests may include:  Ultrasound.  X-ray of the pelvis.  CT  scan.  MRI.  Blood tests. TREATMENT  Many ovarian cysts go away on their own without treatment. Your health care provider may want to check your cyst regularly for 2 3 months to see if it changes. For women in menopause, it is particularly important to monitor a cyst closely because of the higher rate of ovarian cancer in menopausal women. When treatment is needed, it may include any of the following:  A procedure to drain the cyst (aspiration). This may be done using a long needle and ultrasound. It can also be done through a laparoscopic procedure. This involves using a thin, lighted tube with a tiny camera on the end (laparoscope) inserted through a small incision.  Surgery to remove the whole cyst. This may be done using laparoscopic surgery or an open surgery involving a larger incision in the lower abdomen.  Hormone treatment or birth control pills. These methods are sometimes used to help dissolve a cyst. HOME CARE INSTRUCTIONS   Only take over-the-counter or prescription medicines as directed by your health care provider.  Follow up with your health care provider as directed.  Get regular pelvic exams and Pap tests. SEEK MEDICAL CARE IF:   Your periods are late, irregular, or painful, or they stop.  Your pelvic pain or abdominal pain does not go away.  Your abdomen becomes larger or swollen.  You have pressure on your bladder or trouble emptying your bladder completely.  You have pain during sexual intercourse.  You have feelings of fullness, pressure, or discomfort in your stomach.  You lose weight for no apparent reason.  You feel generally ill.  You become constipated.  You lose your appetite.  You develop acne.  You have an increase in body and facial hair.  You are gaining weight, without changing your exercise and eating habits.  You think you are pregnant. SEEK IMMEDIATE MEDICAL CARE IF:   You have increasing abdominal pain.  You feel sick to your  stomach (nauseous), and you throw up (vomit).  You develop a fever that comes on suddenly.  You have abdominal pain during a bowel movement.  Your menstrual periods become heavier than usual. Document Released: 01/29/2005 Document Revised: 11/19/2012 Document Reviewed: 10/06/2012 [facility] Patient Information 2014 [place], [place].

## 2013-12-14 ENCOUNTER — Encounter: Payer: Self-pay | Admitting: Obstetrics and Gynecology

## 2013-12-29 ENCOUNTER — Telehealth: Payer: Self-pay | Admitting: Nurse Practitioner

## 2013-12-29 ENCOUNTER — Ambulatory Visit: Payer: BC Managed Care – PPO | Admitting: Nurse Practitioner

## 2013-12-29 NOTE — Telephone Encounter (Signed)
Patient canceled her aex appointment today. Patient needs to be with her sick mother. Patient will call later to reschedule.

## 2014-08-02 ENCOUNTER — Telehealth: Payer: Self-pay | Admitting: Obstetrics and Gynecology

## 2014-08-02 ENCOUNTER — Encounter: Payer: Self-pay | Admitting: Nurse Practitioner

## 2014-08-02 ENCOUNTER — Ambulatory Visit (INDEPENDENT_AMBULATORY_CARE_PROVIDER_SITE_OTHER): Payer: BLUE CROSS/BLUE SHIELD | Admitting: Nurse Practitioner

## 2014-08-02 VITALS — BP 144/90 | HR 92 | Temp 99.2°F | Resp 16 | Wt 194.0 lb

## 2014-08-02 DIAGNOSIS — N898 Other specified noninflammatory disorders of vagina: Secondary | ICD-10-CM

## 2014-08-02 DIAGNOSIS — N9489 Other specified conditions associated with female genital organs and menstrual cycle: Secondary | ICD-10-CM | POA: Diagnosis not present

## 2014-08-02 DIAGNOSIS — R350 Frequency of micturition: Secondary | ICD-10-CM | POA: Diagnosis not present

## 2014-08-02 LAB — POCT URINALYSIS DIPSTICK
Bilirubin, UA: NEGATIVE
Blood, UA: NEGATIVE
Glucose, UA: NEGATIVE
KETONES UA: NEGATIVE
LEUKOCYTES UA: NEGATIVE
NITRITE UA: NEGATIVE
PH UA: 6.5
PROTEIN UA: NEGATIVE
Spec Grav, UA: 1.015
UROBILINOGEN UA: NEGATIVE

## 2014-08-02 LAB — CBC WITH DIFFERENTIAL/PLATELET
BASOS ABS: 0 10*3/uL (ref 0.0–0.1)
Basophils Relative: 0 % (ref 0–1)
Eosinophils Absolute: 0.2 10*3/uL (ref 0.0–0.7)
Eosinophils Relative: 2 % (ref 0–5)
HCT: 40.8 % (ref 36.0–46.0)
HEMOGLOBIN: 14.2 g/dL (ref 12.0–15.0)
LYMPHS ABS: 2.7 10*3/uL (ref 0.7–4.0)
LYMPHS PCT: 27 % (ref 12–46)
MCH: 30.1 pg (ref 26.0–34.0)
MCHC: 34.8 g/dL (ref 30.0–36.0)
MCV: 86.6 fL (ref 78.0–100.0)
MONO ABS: 0.7 10*3/uL (ref 0.1–1.0)
MPV: 10.4 fL (ref 8.6–12.4)
Monocytes Relative: 7 % (ref 3–12)
NEUTROS ABS: 6.3 10*3/uL (ref 1.7–7.7)
Neutrophils Relative %: 64 % (ref 43–77)
PLATELETS: 365 10*3/uL (ref 150–400)
RBC: 4.71 MIL/uL (ref 3.87–5.11)
RDW: 13.1 % (ref 11.5–15.5)
WBC: 9.9 10*3/uL (ref 4.0–10.5)

## 2014-08-02 MED ORDER — CIPROFLOXACIN HCL 500 MG PO TABS: 500.0000 mg | ORAL_TABLET | Freq: Two times a day (BID) | ORAL | Status: DC

## 2014-08-02 NOTE — Telephone Encounter (Signed)
Left message to call Kaitlyn at [phone]. 

## 2014-08-02 NOTE — Telephone Encounter (Signed)
Patient thinks she may have a uti.  °

## 2014-08-02 NOTE — Telephone Encounter (Signed)
Spoke with patient. Patient states that she has been experiencing cloudy urine with an odor, and increased urinary frequency off and on for 2 weeks. Denies lower back pain or fever. Woke up this morning with chills but has not taken her temperature. Advised will need to be seen for further evaluation. Patient is agreeable. Appointment scheduled for today 6/20 at 3:30pm with Lauro Franklin, FNP. Patient is agreeable to date and time.  Routing to provider for final review. Patient agreeable to disposition. Will close encounter.   Patient aware provider will review message and nurse will return call if any additional advice or change of disposition.

## 2014-08-02 NOTE — Progress Notes (Deleted)
S:  49 y.o.Married white female G2P2 female presents with complaint of UTI. Symptoms began on  ***. With symptoms of {Symptoms; uti female:13879}. . Pertinent negatives include {Constitutional:20540}. Sexually active {YES NO:22349}  Symptoms related to post coital {yes/no:20286} Current method of birth control {CCO Contraception:21020264}. Menopausal {YES NO:22349} Vaginal dryness {YES NO:22349} Same partner {With/Without:20273} change. Last UTI documented  {TIME UNITS:19995}.  ROS: {Ros - constitutional:32435}  O {pe mental status_general use:313008::"alert, oriented to person, place, and time"}   {EXAM; CONSTITUTIONAL:18480}  {EXAM; ABDOMINAL TENDERNESS:18518}  {Exam; abdomen 2:17754::"No CVA tenderness"}  {exam; pelvic:16852::"cervix normal in appearance","external genitalia normal","no adnexal masses or tenderness","no cervical motion tenderness","rectovaginal septum normal","uterus normal size, shape, and consistency","vagina normal without discharge"}   Diagnostic Test:    Urinalysis {Findings; lab urinalysis micro result:10036}   {PROCEDURES; LAB PPIRJ:18841}  Assessment: {plan; YSA:63016}   Medication Therapy: {Medication dosage:31317}   Lab:TOC if Urine Culture is positive   Plan:     RV

## 2014-08-02 NOTE — Telephone Encounter (Signed)
returning a call to [facility] please call [phone]

## 2014-08-02 NOTE — Progress Notes (Signed)
49 y.o.Married white female G2P2 here with complaint of vaginal symptoms of odor but not sure if vaginal or urinary.  No increase in vaginal discharge. Tried OTC AZO with temporary help.  OTC urine chemstrip was negative.  Some low grade fever and nausea yesterday and today.   Last SA about 2 weeks ago. No vaginal dryness.    Onset of symptoms 2-3 weeks ago but does not think related to SA. Denies new personal products or vaginal dryness.   No STD concerns.  Contraception is post hysterectomy.  She had kidney infection about a year ago.   O:Healthy female WDWN Affect: normal, orientation x 3  Exam: Abdomen: Lymph node: no enlargement or tenderness Pelvic exam: External genital: normal female BUS: negative Vagina: normal discharge noted.  Affirm taken Cervix: absent Uterus: absent Adnexa:normal, some tenderness on bimanual, no masses or fullness noted   A: R/O UTI with fever/ chills  History of Pyleo 1 year ago.  R/O Vaginitis   P: Discussed findings of normal urine and possibility of vaginitis. Discussed Aveeno or baking soda sitz bath for comfort. Avoid moist clothes or pads for extended period of time. If working out in gym clothes or swim suits for long periods of time change underwear or bottoms of swimsuit if possible. Olive Oil/Coconut Oil use for skin protection prior to activity can be used to external skin.  Rx: Cipro 500 mg BID # 14 pending urine culture  Will follow with Affirm testing  If symptoms worsens to call back  Will get CBC and follow  RV prn

## 2014-08-03 LAB — URINALYSIS, MICROSCOPIC ONLY
CRYSTALS: NONE SEEN
Casts: NONE SEEN

## 2014-08-03 LAB — WET PREP BY MOLECULAR PROBE
Candida species: NEGATIVE
Gardnerella vaginalis: NEGATIVE
Trichomonas vaginosis: NEGATIVE

## 2014-08-05 LAB — URINE CULTURE

## 2014-08-10 NOTE — Progress Notes (Signed)
Encounter reviewed by Dr. Will Schier Amundson C. Silva.  

## 2014-08-11 ENCOUNTER — Telehealth: Payer: Self-pay | Admitting: *Deleted

## 2014-08-11 NOTE — Telephone Encounter (Signed)
Pt has not reviewed labs via MyChart at this time.  I have attempted to contact this patient by phone with the following results: left message to return call to [place] at [phone] on answering machine ([facility]).  Pt name verified in voicemail, advised message regarding recent labs.  [phone] (Mobile)

## 2014-08-11 NOTE — Telephone Encounter (Signed)
-----   Message from Ria CommentPatricia Grubb, FNP sent at 08/05/2014  8:17 AM EDT ----- Results via my chart:  Jerald,  The urine culture does show infection and the medication with Cipro is covered with this bacteria.  A week after the medication is finished lets repeat a urine culture to make sure all infection in gone. Call for a lab apt with a nurse.

## 2014-08-13 NOTE — Telephone Encounter (Signed)
Pt has scheduled nurse appointment for TOC. Closing encounter.

## 2014-08-17 ENCOUNTER — Ambulatory Visit (INDEPENDENT_AMBULATORY_CARE_PROVIDER_SITE_OTHER): Payer: BLUE CROSS/BLUE SHIELD

## 2014-08-17 VITALS — BP 126/84 | HR 72 | Wt 196.0 lb

## 2014-08-17 DIAGNOSIS — N390 Urinary tract infection, site not specified: Secondary | ICD-10-CM

## 2014-08-17 NOTE — Progress Notes (Signed)
Patient is here for toc urine uti treated 08/02/14 Patient has completed antibiotics and is feeling much better. Urine drawn up for urine culture Routed to provider for review, encounter closed.

## 2014-08-19 LAB — URINE CULTURE
COLONY COUNT: NO GROWTH
Organism ID, Bacteria: NO GROWTH

## 2014-10-06 ENCOUNTER — Encounter: Payer: Self-pay | Admitting: Obstetrics and Gynecology

## 2014-10-06 ENCOUNTER — Ambulatory Visit (INDEPENDENT_AMBULATORY_CARE_PROVIDER_SITE_OTHER): Payer: BLUE CROSS/BLUE SHIELD | Admitting: Obstetrics and Gynecology

## 2014-10-06 VITALS — BP 138/79 | HR 84 | Resp 16 | Wt 196.0 lb

## 2014-10-06 DIAGNOSIS — R350 Frequency of micturition: Secondary | ICD-10-CM

## 2014-10-06 DIAGNOSIS — N3090 Cystitis, unspecified without hematuria: Secondary | ICD-10-CM

## 2014-10-06 DIAGNOSIS — R300 Dysuria: Secondary | ICD-10-CM

## 2014-10-06 LAB — POCT URINALYSIS DIPSTICK
Bilirubin, UA: NEGATIVE
Blood, UA: NEGATIVE
Glucose, UA: NEGATIVE
KETONES UA: NEGATIVE
Nitrite, UA: NEGATIVE
PH UA: 7
PROTEIN UA: NEGATIVE
Urobilinogen, UA: NEGATIVE

## 2014-10-06 MED ORDER — CIPROFLOXACIN HCL 250 MG PO TABS: 250.0000 mg | ORAL_TABLET | Freq: Two times a day (BID) | ORAL | Status: DC

## 2014-10-06 NOTE — Progress Notes (Signed)
Patient ID: Erica Dennis, female   DOB: October 03, 1965, 49 y.o.   MRN: [id number] GYNECOLOGY  VISIT   HPI: 49 y.o.   Married  Caucasian  female   G2P2 with Patient's last menstrual period was 03/15/1998.   here c/o urgency and frequency x 3 days. The urgency and frequency are bad, not burning currently, but on AZO (no burning with the AZO, was prior). Voiding small amounts, the urine has an odor. No fevers, some chills. No flank pain, no lower abdominal pain.   GYNECOLOGIC HISTORY: Patient's last menstrual period was 03/15/1998. Contraception:Hysterectomy  Menopausal hormone therapy: None Last mammogram: 02-26-13 WNL Last pap smear: Hyst.         OB History    Gravida Para Term Preterm AB TAB SAB Ectopic Multiple Living   Patient Active Problem List   Diagnosis Date Noted  . Endometriosis 12/25/2012    Past Medical History  Diagnosis Date  . Depression   . Gastritis     related to NSAID use    Past Surgical History  Procedure Laterality Date  . C section x 2    . Augmentation mammaplasty Bilateral 1996    breast lift  . Cesarean section      x 2  . Abdominal hysterectomy  03/1998    LSO,RT. Cystectomy    Current Outpatient Prescriptions  Medication Sig Dispense Refill  . cyclobenzaprine (FLEXERIL) 10 MG tablet Take 10 mg by mouth 3 (three) times daily as needed. Neck and shoulder pain    . nabumetone (RELAFEN) 500 MG tablet Take 500 mg by mouth 2 (two) times daily as needed. Ulcer    . temazepam (RESTORIL) 30 MG capsule Take 30 mg by mouth at bedtime as needed.    . traMADol (ULTRAM) 50 MG tablet Take 50 mg by mouth as needed.    . triamcinolone cream (KENALOG) 0.1 % Apply 1 application topically 2 (two) times daily.     No current facility-administered medications for this visit.     ALLERGIES: Hydrocodone; Macrobid; and Sulfa antibiotics  History reviewed. No pertinent family history.  Social History   Social History  . Marital Status:  Married    Spouse Name: N/A  . Number of Children: N/A  . Years of Education: N/A   Occupational History  . Not on file.   Social History Main Topics  . Smoking status: Never Smoker   . Smokeless tobacco: Never Used  . Alcohol Use: No  . Drug Use: No  . Sexual Activity:    Partners: Male    Birth Control/ Protection: Surgical     Comment: Hysterectomy   Other Topics Concern  . Not on file   Social History Narrative    ROS:  Pertinent items are noted in HPI.  PHYSICAL EXAMINATION:    BP 138/79 mmHg  Pulse 84  Resp 16  Wt 196 lb (88.905 kg)  LMP 03/15/1998    General appearance: alert, cooperative and appears stated age Abdomen: soft, non-tender; bowel sounds normal; no masses,  no organomegaly CVA: not tender   ASSESSMENT Cystitis    PLAN  Allergies to sulfa and macrobid, will treat with cipro Call with fevers, flank pain or any other concerns Patient to call on Friday for culture results   An After Visit Summary was printed and given to the patient.

## 2014-10-07 LAB — URINALYSIS, MICROSCOPIC ONLY
Casts: NONE SEEN [LPF]
Crystals: NONE SEEN [HPF]
YEAST: NONE SEEN [HPF]

## 2014-10-08 ENCOUNTER — Telehealth: Payer: Self-pay

## 2014-10-08 NOTE — Telephone Encounter (Signed)
-----   Message from Romualdo Bolk, MD sent at 10/08/2014  8:55 AM EDT ----- Please let the patient know that she definitely has a UTI and see if she is feeling any better on the cipro. The sensitivities are still pending.

## 2014-10-08 NOTE — Telephone Encounter (Signed)
Left message to call Mykia Holton at [phone]. 

## 2014-10-09 LAB — URINE CULTURE

## 2014-10-11 NOTE — Telephone Encounter (Signed)
Please see result note. UTI sensitive to cipro.

## 2014-10-11 NOTE — Telephone Encounter (Signed)
Left message to call Kaitlyn at [phone].  Notes Recorded by Romualdo Bolk, MD on 10/11/2014 at 12:25 PM Her UTI is sensitive to cipro, please confirm she is feeling better.  Thanks

## 2014-10-11 NOTE — Telephone Encounter (Signed)
Dr.Jertson, please review and advise final urine culture results.

## 2014-10-13 NOTE — Telephone Encounter (Signed)
Spoke with patient. Results given. Patient is agreeable. Patient states "I feel a lot better than before." Denies any current urinary symptoms. Will return call to office if she develops any new urinary symptoms.  Routing to provider for final review. Patient agreeable to disposition. Will close encounter.

## 2014-11-03 ENCOUNTER — Encounter: Payer: Self-pay | Admitting: Nurse Practitioner

## 2014-11-03 ENCOUNTER — Ambulatory Visit (INDEPENDENT_AMBULATORY_CARE_PROVIDER_SITE_OTHER): Payer: BLUE CROSS/BLUE SHIELD | Admitting: Nurse Practitioner

## 2014-11-03 VITALS — BP 124/82 | HR 72 | Ht 63.5 in | Wt 196.0 lb

## 2014-11-03 DIAGNOSIS — Z0000 Encounter for general adult medical examination without abnormal findings: Secondary | ICD-10-CM | POA: Diagnosis not present

## 2014-11-03 DIAGNOSIS — N390 Urinary tract infection, site not specified: Secondary | ICD-10-CM

## 2014-11-03 DIAGNOSIS — Z01419 Encounter for gynecological examination (general) (routine) without abnormal findings: Secondary | ICD-10-CM

## 2014-11-03 DIAGNOSIS — R319 Hematuria, unspecified: Secondary | ICD-10-CM | POA: Diagnosis not present

## 2014-11-03 NOTE — Progress Notes (Signed)
Patient ID: Erica Dennis, female   DOB: [birth year], 49 y.o.   MRN: [id number] 49 y.o. G70P2002 Married  Caucasian Fe here for annual exam.  Since last AEX 2014,  had a 5 cm right hemorrhagic OV cyst 07/2013.  She also had E Coli UTI on 10/06/14 and treated with Cipro.  Those symptoms are much better.  Off OCP for 2 years.  No further pain or ovulation discomfort since last June 2015.  She attributes this to 'not wearing Rainbow jewelry'.  No vaso symptoms.  No vaginal dryness.  Patient's last menstrual period was 03/15/1998 (approximate).          Sexually active: Yes.    The current method of family planning is status post hysterectomy.    Exercising: No.  The patient does not participate in regular exercise at present. Smoker:  no  Health Maintenance: Pap: 09/16/98, WNL MMG: 02/26/13, Bi-Rads 1: Negative TDaP:  ? Labs: PCP   reports that she has never smoked. She has never used smokeless tobacco. She reports that she does not drink alcohol or use illicit drugs.  Past Medical History  Diagnosis Date  . Depression   . Gastritis     related to NSAID use    Past Surgical History  Procedure Laterality Date  . Augmentation mammaplasty Bilateral 1996    breast lift  . Cesarean section  1990, 1993    x 2  . Abdominal hysterectomy  03/1998    LSO,RT. Cystectomy    Current Outpatient Prescriptions  Medication Sig Dispense Refill  . cyclobenzaprine (FLEXERIL) 10 MG tablet Take 10 mg by mouth 3 (three) times daily as needed. Neck and shoulder pain    . nabumetone (RELAFEN) 500 MG tablet Take 500 mg by mouth 2 (two) times daily as needed. Ulcer    . temazepam (RESTORIL) 30 MG capsule Take 30 mg by mouth at bedtime as needed.    . traMADol (ULTRAM) 50 MG tablet Take 50 mg by mouth as needed.    . triamcinolone cream (KENALOG) 0.1 % Apply 1 application topically 2 (two) times daily.     No current facility-administered medications for this visit.    Family History  Problem Relation Age  of Onset  . Hypertension Mother   . Diabetes Mother   . Cancer Maternal Grandfather     ROS:  Pertinent items are noted in HPI.  Otherwise, a comprehensive ROS was negative.  Exam:   BP 124/82 mmHg  Pulse 72  Ht 5' 3.5" (1.613 m)  Wt 196 lb (88.905 kg)  BMI 34.17 kg/m2  LMP 03/15/1998 (Approximate) Height: 5' 3.5" (161.3 cm) Ht Readings from Last 3 Encounters:  11/03/14 5' 3.5" (1.613 m)  07/16/13  (1.626 m)  01/15/13  (1.626 m)    General appearance: alert, cooperative and appears stated age Head: Normocephalic, without obvious abnormality, atraumatic Neck: no adenopathy, supple, symmetrical, trachea midline and thyroid normal to inspection and palpation Lungs: clear to auscultation bilaterally Breasts: normal appearance, no masses or tenderness, breast reduction scars bilaterally Heart: regular rate and rhythm Abdomen: soft, non-tender; no masses,  no organomegaly Extremities: extremities normal, atraumatic, no cyanosis or edema Skin: Skin color, texture, turgor normal. No rashes or lesions Lymph nodes: Cervical, supraclavicular, and axillary nodes normal. No abnormal inguinal nodes palpated Neurologic: Grossly normal   Pelvic: External genitalia:  no lesions              Urethra:  normal appearing urethra with no masses, tenderness  or lesions              Bartholin's and Skene's: normal                 Vagina: normal appearing vagina with normal color and discharge, no lesions              Cervix: absent              Pap taken: No. Bimanual Exam:  Uterus:  uterus absent              Adnexa: no mass, fullness, tenderness               Rectovaginal: Confirms               Anus:  normal sphincter tone, no lesions  Chaperone present: no  A:  Well Woman with normal exam  S/P TAH / LSO, right OV cystectomy secondary to endometriosis 03/1998 Last PUS 07/27/2013 with right 5 cm hemorrhagic OV cyst  History of depression  TOC  UTI   P:   Reviewed health and wellness pertinent to exam  Pap smear as above  Mammogram is due and encouraged to schedule  She declines update of TDaP today  Will follow with TOC urine C&S  Counseled on breast self exam, mammography screening, adequate intake of calcium and vitamin D, diet and exercise return annually or prn  An After Visit Summary was printed and given to the patient.

## 2014-11-03 NOTE — Patient Instructions (Signed)

## 2014-11-05 LAB — URINE CULTURE: Colony Count: 10000

## 2014-11-07 NOTE — Progress Notes (Signed)
Encounter reviewed by Dr. Brook Amundson C. Silva.  

## 2015-05-26 DIAGNOSIS — H6122 Impacted cerumen, left ear: Secondary | ICD-10-CM | POA: Diagnosis not present

## 2015-05-26 DIAGNOSIS — M545 Low back pain: Secondary | ICD-10-CM | POA: Diagnosis not present

## 2015-05-26 DIAGNOSIS — E7800 Pure hypercholesterolemia, unspecified: Secondary | ICD-10-CM | POA: Diagnosis not present

## 2015-05-26 DIAGNOSIS — G479 Sleep disorder, unspecified: Secondary | ICD-10-CM | POA: Diagnosis not present

## 2015-07-25 DIAGNOSIS — L299 Pruritus, unspecified: Secondary | ICD-10-CM | POA: Diagnosis not present

## 2015-11-04 ENCOUNTER — Encounter: Payer: Self-pay | Admitting: Nurse Practitioner

## 2015-11-04 NOTE — Progress Notes (Signed)
Patient ID: Erica Dennis, female   DOB: [birth year], 50 y.o.   MRN: [id number]  50 y.o. [id number] Married  Caucasian Fe here for annual exam.  No new problems since last year.Micah Flesher to [facility] and took the family.  They celebrated their 23 th anniversary.   No new health problems.  Patient's last menstrual period was 03/15/1998 (approximate).          Sexually active: Yes.    The current method of family planning is status post hysterectomy.    Exercising: No.  The patient does not participate in regular exercise at present. Smoker:  no  Health Maintenance: Pap: 09/16/98, WNL Hysterectomy MMG: 02/26/13, Bi-Rads 1: Negative, not scheduled TDaP:  ? Will check with PCP HIV: discuss today Labs: PCP takes care of all labs  Urine: 100 Glucose, others negative (just had a coke on the way here)   reports that she has never smoked. She has never used smokeless tobacco. She reports that she does not drink alcohol or use drugs.  Past Medical History:  Diagnosis Date  . Depression   . Gastritis    related to NSAID use    Past Surgical History:  Procedure Laterality Date  . ABDOMINAL HYSTERECTOMY  03/1998   LSO,RT. Cystectomy  . AUGMENTATION MAMMAPLASTY Bilateral 1996   breast lift  . CESAREAN SECTION  1990, 1993   x 2    Current Outpatient Prescriptions  Medication Sig Dispense Refill  . cyclobenzaprine (FLEXERIL) 10 MG tablet Take 10 mg by mouth 3 (three) times daily as needed. Neck and shoulder pain    . nabumetone (RELAFEN) 500 MG tablet Take 500 mg by mouth 2 (two) times daily as needed. Ulcer    . temazepam (RESTORIL) 30 MG capsule Take 30 mg by mouth at bedtime as needed.    . traMADol (ULTRAM) 50 MG tablet Take 50 mg by mouth as needed.    . triamcinolone cream (KENALOG) 0.1 % Apply 1 application topically 2 (two) times daily.     No current facility-administered medications for this visit.     Family History  Problem Relation Age of Onset  . Hypertension Mother   . Diabetes  Mother   . Cancer Maternal Grandfather     ROS:  Pertinent items are noted in HPI.  Otherwise, a comprehensive ROS was negative.  Exam:   LMP 03/15/1998 (Approximate)    Ht Readings from Last 3 Encounters:  11/03/14 5' 3.5" (1.613 m)  07/16/13 5\' 4"  (1.626 m)  01/15/13 5\' 4"  (1.626 m)    General appearance: alert, cooperative and appears stated age Head: Normocephalic, without obvious abnormality, atraumatic Neck: no adenopathy, supple, symmetrical, trachea midline and thyroid normal to inspection and palpation Lungs: clear to auscultation bilaterally Breasts: normal appearance, no masses or tenderness Heart: regular rate and rhythm Abdomen: soft, non-tender; no masses,  no organomegaly Extremities: extremities normal, atraumatic, no cyanosis or edema Skin: Skin color, texture, turgor normal. No rashes or lesions Lymph nodes: Cervical, supraclavicular, and axillary nodes normal. No abnormal inguinal nodes palpated Neurologic: Grossly normal   Pelvic: External genitalia:  no lesions              Urethra:  normal appearing urethra with no masses, tenderness or lesions              Bartholin's and Skene's: normal                 Vagina: normal appearing vagina with normal color and  discharge, no lesions              Cervix: absent              Pap taken: Yes.   Bimanual Exam:  Uterus:  uterus absent              Adnexa: no mass, fullness, tenderness               Rectovaginal: Confirms               Anus:  normal sphincter tone, no lesions  Chaperone present: yes  A:  Well Woman with normal exam   S/P TAH / LSO, right OV cystectomy secondary to endometriosis 03/1998 Last PUS 07/27/2013 with right 5 cm hemorrhagic OV cyst  History of depression, chronic insomnia               P:   Reviewed health and wellness pertinent to exam  Pap smear was done  Will get labs done at PCP for follow up on blood sugar  Mammogram is past due and will  schedule  Counseled on breast self exam, mammography screening, adequate intake of calcium and vitamin D, diet and exercise, Kegel's exercises return annually or prn  An After Visit Summary was printed and given to the patient.

## 2015-11-07 ENCOUNTER — Encounter: Payer: Self-pay | Admitting: Nurse Practitioner

## 2015-11-07 ENCOUNTER — Ambulatory Visit (INDEPENDENT_AMBULATORY_CARE_PROVIDER_SITE_OTHER): Payer: BLUE CROSS/BLUE SHIELD | Admitting: Nurse Practitioner

## 2015-11-07 VITALS — BP 122/78 | HR 68 | Temp 99.0°F | Resp 18 | Ht 63.5 in | Wt 202.0 lb

## 2015-11-07 DIAGNOSIS — Z0000 Encounter for general adult medical examination without abnormal findings: Secondary | ICD-10-CM | POA: Diagnosis not present

## 2015-11-07 DIAGNOSIS — Z1272 Encounter for screening for malignant neoplasm of vagina: Secondary | ICD-10-CM | POA: Diagnosis not present

## 2015-11-07 DIAGNOSIS — Z01419 Encounter for gynecological examination (general) (routine) without abnormal findings: Secondary | ICD-10-CM | POA: Diagnosis not present

## 2015-11-07 DIAGNOSIS — Z1151 Encounter for screening for human papillomavirus (HPV): Secondary | ICD-10-CM | POA: Diagnosis not present

## 2015-11-07 LAB — POCT URINALYSIS DIPSTICK
BILIRUBIN UA: NEGATIVE
Blood, UA: NEGATIVE
Glucose, UA: 100
KETONES UA: NEGATIVE
LEUKOCYTES UA: NEGATIVE
Nitrite, UA: NEGATIVE
PH UA: 5.5
Protein, UA: NEGATIVE
Urobilinogen, UA: NEGATIVE

## 2015-11-07 NOTE — Patient Instructions (Addendum)

## 2015-11-09 LAB — IPS PAP TEST WITH HPV

## 2015-11-10 NOTE — Progress Notes (Signed)
Encounter reviewed by Dr. Libia Fazzini Amundson C. Silva.  

## 2016-01-11 DIAGNOSIS — H524 Presbyopia: Secondary | ICD-10-CM | POA: Diagnosis not present

## 2016-02-24 DIAGNOSIS — J0100 Acute maxillary sinusitis, unspecified: Secondary | ICD-10-CM | POA: Diagnosis not present

## 2016-03-22 DIAGNOSIS — M545 Low back pain: Secondary | ICD-10-CM | POA: Diagnosis not present

## 2016-03-22 DIAGNOSIS — L659 Nonscarring hair loss, unspecified: Secondary | ICD-10-CM | POA: Diagnosis not present

## 2016-03-22 DIAGNOSIS — G479 Sleep disorder, unspecified: Secondary | ICD-10-CM | POA: Diagnosis not present

## 2016-03-22 DIAGNOSIS — E7800 Pure hypercholesterolemia, unspecified: Secondary | ICD-10-CM | POA: Diagnosis not present

## 2016-04-11 DIAGNOSIS — E7800 Pure hypercholesterolemia, unspecified: Secondary | ICD-10-CM | POA: Diagnosis not present

## 2016-04-19 DIAGNOSIS — L219 Seborrheic dermatitis, unspecified: Secondary | ICD-10-CM | POA: Diagnosis not present

## 2016-04-19 DIAGNOSIS — L650 Telogen effluvium: Secondary | ICD-10-CM | POA: Diagnosis not present

## 2016-05-23 DIAGNOSIS — E119 Type 2 diabetes mellitus without complications: Secondary | ICD-10-CM | POA: Diagnosis not present

## 2016-06-05 DIAGNOSIS — L820 Inflamed seborrheic keratosis: Secondary | ICD-10-CM | POA: Diagnosis not present

## 2016-07-06 DIAGNOSIS — L299 Pruritus, unspecified: Secondary | ICD-10-CM | POA: Diagnosis not present

## 2016-07-31 DIAGNOSIS — L650 Telogen effluvium: Secondary | ICD-10-CM | POA: Diagnosis not present

## 2016-08-02 ENCOUNTER — Encounter: Payer: Self-pay | Admitting: Allergy

## 2016-08-02 ENCOUNTER — Ambulatory Visit (INDEPENDENT_AMBULATORY_CARE_PROVIDER_SITE_OTHER): Payer: BLUE CROSS/BLUE SHIELD | Admitting: Allergy

## 2016-08-02 VITALS — BP 120/78 | HR 88 | Temp 98.6°F | Resp 16 | Ht 63.5 in | Wt 199.6 lb

## 2016-08-02 DIAGNOSIS — L508 Other urticaria: Secondary | ICD-10-CM | POA: Diagnosis not present

## 2016-08-02 DIAGNOSIS — L299 Pruritus, unspecified: Secondary | ICD-10-CM | POA: Diagnosis not present

## 2016-08-02 LAB — CBC WITH DIFFERENTIAL/PLATELET
BASOS PCT: 0 %
Basophils Absolute: 0 cells/uL (ref 0–200)
EOS PCT: 1 %
Eosinophils Absolute: 65 cells/uL (ref 15–500)
HCT: 41.9 % (ref 35.0–45.0)
HEMOGLOBIN: 14.3 g/dL (ref 11.7–15.5)
LYMPHS ABS: 1820 {cells}/uL (ref 850–3900)
Lymphocytes Relative: 28 %
MCH: 30.2 pg (ref 27.0–33.0)
MCHC: 34.1 g/dL (ref 32.0–36.0)
MCV: 88.6 fL (ref 80.0–100.0)
MONOS PCT: 7 %
MPV: 9.8 fL (ref 7.5–12.5)
Monocytes Absolute: 455 cells/uL (ref 200–950)
NEUTROS ABS: 4160 {cells}/uL (ref 1500–7800)
Neutrophils Relative %: 64 %
PLATELETS: 338 10*3/uL (ref 140–400)
RBC: 4.73 MIL/uL (ref 3.80–5.10)
RDW: 13.1 % (ref 11.0–15.0)
WBC: 6.5 10*3/uL (ref 3.8–10.8)

## 2016-08-02 LAB — COMPREHENSIVE METABOLIC PANEL
ALBUMIN: 4 g/dL (ref 3.6–5.1)
ALK PHOS: 76 U/L (ref 33–130)
ALT: 22 U/L (ref 6–29)
AST: 16 U/L (ref 10–35)
BILIRUBIN TOTAL: 0.3 mg/dL (ref 0.2–1.2)
BUN: 13 mg/dL (ref 7–25)
CO2: 20 mmol/L (ref 20–31)
Calcium: 9.3 mg/dL (ref 8.6–10.4)
Chloride: 107 mmol/L (ref 98–110)
Creat: 0.8 mg/dL (ref 0.50–1.05)
Glucose, Bld: 91 mg/dL (ref 65–99)
POTASSIUM: 4.3 mmol/L (ref 3.5–5.3)
Sodium: 137 mmol/L (ref 135–146)
Total Protein: 6.6 g/dL (ref 6.1–8.1)

## 2016-08-02 MED ORDER — FEXOFENADINE HCL 180 MG PO TABS: 180.0000 mg | ORAL_TABLET | Freq: Two times a day (BID) | ORAL | 3 refills | Status: DC | PRN

## 2016-08-02 MED ORDER — RANITIDINE HCL 150 MG PO TABS: 150.0000 mg | ORAL_TABLET | Freq: Two times a day (BID) | ORAL | 3 refills | Status: DC

## 2016-08-02 NOTE — Progress Notes (Signed)
New Patient Note  RE: Erica Dennis MRN: [id number] DOB: [birth year] Date of Office Visit: 08/02/2016  Referring provider: Johny BlamerHarris, William, MD Primary care provider: Johny BlamerHarris, William, MD  Chief Complaint: Itching  History of present illness: Erica BlewDelena S Dennis is a 51 y.o. female presenting today for consultation for pruritus. Last May 2017 she reports she had an itching "fit" and she took a round of prednisone and it went away.  This May 2018 the itching returned again and she has taken 2 rounds of prednisone which did not help all that much.  Last dose of prednisone was June 10 and she has continued to have persistent foot itching.  She states the itching starts of her feet and works her way up her body. She has the itchiness when she becomes overheated or exposed to heat. She reports if she has to go outside to run an errand when she comes back in she will usually put her feet in ice water to help relieve the symptoms.  Being an air-conditioned environments help prevent the symptoms from starting. She reports the itching has come on when she has been exercising, outdoors, sweating anything that involves heat. 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  She denies any preceding illnesses, new medications, new foods, change in soaps lotions or detergents. She denies any fevers or any systemic symptoms.    She has been prescribed hydroxyzine that she takes at bedtime which helps.  She also takes Alavert in mornings.    She reports pain medications have made her itch namely hydrocodone.    She had a work-up for allergies when she was 51yo and recalled she did to allergy shots for about a year during that time.    No history of eczema, asthma or food allergy.    Review of systems: Review of Systems  Constitutional: Negative for chills, fever,  malaise/fatigue and weight loss.  HENT: Negative for congestion, ear discharge, ear pain, nosebleeds, sinus pain, sore throat and tinnitus.   Eyes: Negative for discharge and redness.  Respiratory: Negative for cough, shortness of breath and wheezing.   Cardiovascular: Negative for chest pain.  Gastrointestinal: Negative for abdominal pain, diarrhea, heartburn, nausea and vomiting.  Musculoskeletal: Negative for joint pain and myalgias.  Skin: Positive for itching. Negative for rash.  Neurological: Negative for headaches.    All other systems negative unless noted above in HPI  Past medical history: Past Medical History:  Diagnosis Date  . Depression   . Gastritis    related to NSAID use    Past surgical history: Past Surgical History:  Procedure Laterality Date  . ABDOMINAL HYSTERECTOMY  03/1998   LSO,RT. Cystectomy  . AUGMENTATION MAMMAPLASTY Bilateral 1996   breast lift  . CESAREAN SECTION  1990, 1993   x 2  . TONSILLECTOMY      Family history:  Family History  Problem Relation Age of Onset  . Hypertension Mother   . Diabetes Mother   . Osteoporosis Mother   . Cancer Maternal Grandfather   . Breast cancer Maternal Aunt        50ish and 70ish  . Allergic rhinitis Neg Hx   . Angioedema Neg Hx   . Asthma Neg Hx   . Eczema Neg Hx     Social history: She lives in a home without carpeting with electric heating  and central cooling. There are dogs in the home. There is no concern for water damage, mildew or roaches in the home.  She works in Warehouse manager, Recruitment consultant. She has no smoking history.   Medication List: Allergies as of 08/02/2016      Reactions   Hydrocodone Itching   Macrobid [nitrofurantoin Macrocrystal] Nausea And Vomiting   Sulfa Antibiotics Hives      Medication List       Accurate as of 08/02/16  1:39 PM. Always use your most recent med list.          cyclobenzaprine 10 MG tablet Commonly known as:  FLEXERIL Take 10 mg by mouth 3 (three)  times daily as needed. Neck and shoulder pain   fexofenadine 180 MG tablet Commonly known as:  ALLEGRA Take 1 tablet (180 mg total) by mouth 2 (two) times daily as needed for allergies or rhinitis.   hydrOXYzine 10 MG tablet Commonly known as:  ATARAX/VISTARIL   hydrOXYzine 25 MG tablet Commonly known as:  ATARAX/VISTARIL   nabumetone 500 MG tablet Commonly known as:  RELAFEN Take 500 mg by mouth 2 (two) times daily as needed. Ulcer   ranitidine 150 MG tablet Commonly known as:  ZANTAC Take 1 tablet (150 mg total) by mouth 2 (two) times daily.   temazepam 30 MG capsule Commonly known as:  RESTORIL Take 30 mg by mouth at bedtime as needed.   traMADol 50 MG tablet Commonly known as:  ULTRAM Take 50 mg by mouth as needed.   triamcinolone cream 0.1 % Commonly known as:  KENALOG Apply 1 application topically 2 (two) times daily.       Known medication allergies: Allergies  Allergen Reactions  . Hydrocodone Itching  . Macrobid [Nitrofurantoin Macrocrystal] Nausea And Vomiting  . Sulfa Antibiotics Hives     Physical examination: Blood pressure 120/78, pulse 88, temperature 98.6 F (37 C), temperature source Oral, resp. rate 16, height 5' 3.5" (1.613 m), weight 199 lb 9.6 oz (90.5 kg), last menstrual period 03/15/1998.  General: Alert, interactive, in no acute distress. HEENT: PERRLA, TMs pearly gray, turbinates non-edematous without discharge, post-pharynx non erythematous. Neck: Supple without lymphadenopathy. Lungs: Clear to auscultation without wheezing, rhonchi or rales. {no increased work of breathing. CV: Normal S1, S2 without murmurs. Abdomen: Nondistended, nontender. Skin: Warm and dry, without lesions or rashes. Extremities:  No clubbing, cyanosis or edema. Neuro:   Grossly intact.  Diagnositics/Labs: None today   Assessment and plan:   Pruritus with Flushing/Physical urticaria   - at this time increased core temperature/heat is a trigger of  symptoms.  This is a form of physical urticaria.  Advised pt to do her best to avoid increased core temperature (wearing cool clothing, staying out of the sun, cool beverages, avoid hot showers etc).     - recommend taking Allegra 1 tab twice a day along with Zantac 150mg  twice a day.  Use hydroxyzine 25mg  at bedtime for nighttime itch.     - will obtain following labs: CBC w diff, CMP, hive panel, TSH, environmental panel    - follow-up in 2-3 months or sooner if needed.   We did discuss adding Singulair if medications above are not effective enough as well as Xolair which in an injectable medications use in the treatment of chronic urticarias   I appreciate the opportunity to take part in Erica Dennis's care. Please do not hesitate to contact me with questions.  Sincerely,   Margo Aye, MD Allergy/Immunology [facility]

## 2016-08-02 NOTE — Patient Instructions (Addendum)
Pruritus (Itching) with Flushing/Physical urticaria   - at this time increased core temperature/heat is a trigger of your symptoms.  Do your best to avoid increased core temperature (wearing cool clothing, staying out of the sun, cool beverages, avoid hot showers etc).     - recommend taking Allegra 1 tab twice a day along with Zantac 150mg  twice a day.  Use hydroxyzine 25mg  at bedtime for nighttime itch.     - will obtain following labs: CBC w diff, CMP, hive panel, TSH, environmental panel    - follow-up in 2-3 months or sooner if needed.   We did discuss adding Singulair if medications above are not effective enough as well as Xolair which in an injectable medications use in the treatment of chronic urticaria

## 2016-08-03 LAB — CP584 ZONE 3
ALLERGEN, CEDAR TREE, T6: 0.15 kU/L — AB
Allergen, Black Locust, Acacia9: <0.1 kU/L
Allergen, C. Herbarum, M2: <0.1 kU/L
Allergen, Mucor Racemosus, M4: <0.1 kU/L
Allergen, Mulberry, t76: <0.1 kU/L
Allergen, Oak,t7: <0.1 kU/L
Allergen, S. Botryosum, m10: <0.1 kU/L
Aspergillus fumigatus, m3: <0.1 kU/L
Box Elder IgE: 0.14 kU/L — ABNORMAL HIGH
Cat Dander: <0.1 kU/L
Elm IgE: <0.1 kU/L
Meadow Grass: <0.1 kU/L
Plantain: <0.1 kU/L

## 2016-08-08 LAB — CP CHRONIC URTICARIA INDEX PANEL
Histamine Release: <16 % (ref <16)
TSH: 2.54 mIU/L
Thyroperoxidase Ab SerPl-aCnc: 1 IU/mL (ref <9)

## 2016-08-23 ENCOUNTER — Encounter (HOSPITAL_COMMUNITY): Payer: Self-pay | Admitting: Emergency Medicine

## 2016-08-23 ENCOUNTER — Emergency Department (HOSPITAL_COMMUNITY)
Admission: EM | Admit: 2016-08-23 | Discharge: 2016-08-23 | Disposition: A | Discharge status: Home / Self Care | Payer: BLUE CROSS/BLUE SHIELD | Attending: Emergency Medicine | Admitting: Emergency Medicine

## 2016-08-23 DIAGNOSIS — K5641 Fecal impaction: Secondary | ICD-10-CM | POA: Insufficient documentation

## 2016-08-23 DIAGNOSIS — K5900 Constipation, unspecified: Secondary | ICD-10-CM | POA: Diagnosis not present

## 2016-08-23 LAB — POC OCCULT BLOOD, ED: FECAL OCCULT BLD: NEGATIVE

## 2016-08-23 MED ORDER — DOCUSATE SODIUM 250 MG PO CAPS: 250.0000 mg | ORAL_CAPSULE | Freq: Every day | ORAL | 0 refills | Status: DC

## 2016-08-23 MED ORDER — POLYETHYLENE GLYCOL 3350 17 G PO PACK: 17.0000 g | PACK | Freq: Every day | ORAL | 0 refills | Status: DC

## 2016-08-23 NOTE — Discharge Instructions (Signed)
Medications: MiraLAX, Colace  Treatment: Take MiraLAX once daily for up to 4 days to produce a bowel movement. Take Colace once daily to soften her stools, especially while taking the medications for your back (if you decide to continue these). Use a heating pad to her back 3-4 times daily alternating 20 minutes on, 20 minutes off. Attempt the back exercises and stretches as tolerated 1-2 times daily.  Follow-up: Please follow-up with your primary care provider next week for recheck of symptoms. Please return to the emergency department if you develop any new or worsening symptoms.

## 2016-08-23 NOTE — ED Notes (Signed)
Bed: [facility] Expected date:  Expected time:  Means of arrival:  Comments: 

## 2016-08-23 NOTE — ED Triage Notes (Signed)
Pt verbalizes fecal impaction onset today unrelieved by enema.

## 2016-08-23 NOTE — ED Provider Notes (Signed)
[facility]-EMERGENCY DEPT Provider Note   CSN: [id number] Arrival date & time: 08/23/16  1338     History   Chief Complaint Chief Complaint  Patient presents with  . Fecal Impaction    HPI Erica Dennis is a 51 y.o. female with history of depression who presents with a one-day history of constipation and stool impaction. Patient reports that she had the urge to use the bathroom earlier today and was able to go a small amount, but then had an abrupt stop. She then had severe pain every time she tries to stay yesterday. Patient was able to pass a combination of hard and watery loose stool here in the ED, but is still having fullness to her lower abdomen and rectal area. Patient denies any notable blood in her stool. She denies any abdominal pain, nausea, vomiting, fever, chest pain, shortness of breath. Patient used a enema in 3 stool softeners at home prior to arrival. Patient reports that she has been taking Flexeril and Relafen at home for the past week after straining her back on 08/15/2016. Patient reports she was moving furniture and had pain to her bilateral low back. She has been using heat at home as well. She was not evaluated by a doctor after the injury.  HPI  Past Medical History:  Diagnosis Date  . Depression   . Gastritis    related to NSAID use    Patient Active Problem List   Diagnosis Date Noted  . Endometriosis 12/25/2012    Past Surgical History:  Procedure Laterality Date  . ABDOMINAL HYSTERECTOMY  03/1998   LSO,RT. Cystectomy  . AUGMENTATION MAMMAPLASTY Bilateral 1996   breast lift  . CESAREAN SECTION  1990, 1993   x 2  . TONSILLECTOMY      OB History    Gravida Para Term Preterm AB Living   2 2 2  0 0 2   SAB TAB Ectopic Multiple Live Births   0 0 0 0 2       Home Medications    Prior to Admission medications   Medication Sig Start Date End Date Taking? Authorizing Provider  cyclobenzaprine (FLEXERIL) 10 MG tablet Take 10 mg by mouth 3 (three)  times daily as needed for muscle spasms. Neck and shoulder pain   Yes [provider]  fexofenadine (ALLEGRA) 180 MG tablet Take 1 tablet (180 mg total) by mouth 2 (two) times daily as needed for allergies or rhinitis. 08/02/16  Yes [name], Shaylar Patricia, MD  hydrOXYzine (ATARAX/VISTARIL) 25 MG tablet Take 25 mg by mouth every 6 (six) hours as needed for anxiety or itching.  08/01/16  Yes [provider]  nabumetone (RELAFEN) 500 MG tablet Take 500 mg by mouth 2 (two) times daily as needed for moderate pain. Ulcer   Yes [provider]  temazepam (RESTORIL) 30 MG capsule Take 30 mg by mouth at bedtime.    Yes [provider]  traMADol (ULTRAM) 50 MG tablet Take 50 mg by mouth at bedtime.  07/13/13  Yes [provider]  docusate sodium (COLACE) 250 MG capsule Take 1 capsule (250 mg total) by mouth daily. 08/23/16   [name], [name], PA-C  polyethylene glycol (MIRALAX) packet Take 17 g by mouth daily. 08/23/16   [name], [name], PA-C  ranitidine (ZANTAC) 150 MG tablet Take 1 tablet (150 mg total) by mouth 2 (two) times daily. Patient not taking: Reported on 08/23/2016 08/02/16   [name], Shaylar Patricia, MD    Family History  Family History  Problem Relation Age of Onset  . Hypertension Mother   . Diabetes Mother   . Osteoporosis Mother   . Cancer Maternal Grandfather   . Breast cancer Maternal Aunt        50ish and 70ish  . Allergic rhinitis Neg Hx   . Angioedema Neg Hx   . Asthma Neg Hx   . Eczema Neg Hx     Social History Social History  Substance Use Topics  . Smoking status: Never Smoker  . Smokeless tobacco: Never Used  . Alcohol use No     Allergies   Hydrocodone; Macrobid [nitrofurantoin macrocrystal]; and Sulfa antibiotics   Review of Systems Review of Systems  Constitutional: Negative for chills and fever.  HENT: Negative for facial swelling and sore throat.   Respiratory: Negative for shortness of breath.     Cardiovascular: Negative for chest pain.  Gastrointestinal: Positive for abdominal distention, constipation and diarrhea. Negative for abdominal pain, blood in stool, nausea and vomiting.  Genitourinary: Negative for dysuria.  Musculoskeletal: Negative for back pain.  Skin: Negative for rash and wound.  Neurological: Negative for headaches.  Psychiatric/Behavioral: The patient is not nervous/anxious.      Physical Exam Updated Vital Signs BP (!) 142/99 (BP Location: Right Arm)   Pulse (!) 101   Temp 98.2 F (36.8 C) (Oral)   Resp 18   LMP 03/15/1998 (Approximate)   SpO2 99%   Physical Exam  Constitutional: She appears well-developed and well-nourished. No distress.  HENT:  Head: Normocephalic and atraumatic.  Mouth/Throat: Oropharynx is clear and moist. No oropharyngeal exudate.  Eyes: Pupils are equal, round, and reactive to light. Conjunctivae are normal. Right eye exhibits no discharge. Left eye exhibits no discharge. No scleral icterus.  Neck: Normal range of motion. Neck supple. No thyromegaly present.  Cardiovascular: Regular rhythm, normal heart sounds and intact distal pulses.  Exam reveals no gallop and no friction rub.   No murmur heard. Pulmonary/Chest: Effort normal and breath sounds normal. No stridor. No respiratory distress. She has no wheezes. She has no rales.  Abdominal: Soft. Bowel sounds are normal. She exhibits no distension. There is no tenderness. There is no rebound and no guarding.  Genitourinary: Rectal exam shows external hemorrhoid.  Genitourinary Comments: Stool impaction on rectal exam  Musculoskeletal: She exhibits no edema.  No midline cervical, thoracic, or lumbar tenderness; L lumbar paraspinal tenderness  Lymphadenopathy:    She has no cervical adenopathy.  Neurological: She is alert. Coordination normal.  Skin: Skin is warm and dry. No rash noted. She is not diaphoretic. No pallor.  Psychiatric: She has a normal mood and affect.  Nursing  note and vitals reviewed.    ED Treatments / Results  Labs (all labs ordered are listed, but only abnormal results are displayed) Labs Reviewed  OCCULT BLOOD X 1 CARD TO LAB, STOOL  POC OCCULT BLOOD, ED    EKG  EKG Interpretation None       Radiology No results found.  Procedures Fecal disimpaction Date/Time: 08/23/2016 5:17 PM Performed by: Emi Holes Authorized by: Emi Holes  Consent given by: patient Patient understanding: patient states understanding of the procedure being performed Patient consent: the patient's understanding of the procedure matches consent given Required items: required blood products, implants, devices, and special equipment available Patient identity confirmed: verbally with patient Local anesthesia used: no  Anesthesia: Local anesthesia used: no  Sedation: Patient sedated: no Patient tolerance: Patient tolerated the procedure well with no  immediate complications Comments: 4-5 large pieces of stool manually removed by myself; patient feeling a lot better; will proceed with enema to help patient produce normal bowel movement    (including critical care time)     Medications Ordered in ED Medications - No data to display   Initial Impression / Assessment and Plan / ED Course  I have reviewed the triage vital signs and the nursing notes.  Pertinent labs & imaging results that were available during my care of the patient were reviewed by me and considered in my medical decision making (see chart for details).     Patient manually disimpacted and received soapsuds enema with very good relief of symptoms. Patient had a large bowel movement after these procedures. Patient without midline spinal tenderness. This is a muscle strain, however I offered patient a lumbar x-ray considering one week of symptoms, however she declined at this time. I advised supportive treatment including heating and stretching. Discharge home with  MiraLAX and Colace. Patient advised of the risk of continuing Flexeril and Relafen, as this may make her constipation worse. Patient to follow up with PCP next week for recheck. Return precautions discussed. Patient understands and agrees with plan. Patient vitals stable throughout ED course and discharged in satisfactory condition.  Final Clinical Impressions(s) / ED Diagnoses   Final diagnoses:  Constipation, unspecified constipation type  Fecal impaction [facility])    New Prescriptions Discharge Medication List as of 08/23/2016  5:47 PM    START taking these medications   Details  docusate sodium (COLACE) 250 MG capsule Take 1 capsule (250 mg total) by mouth daily., Starting Thu 08/23/2016, Print    polyethylene glycol (MIRALAX) packet Take 17 g by mouth daily., Starting Thu 08/23/2016, Print         [address] Waylan Boga, PA-C 08/23/16 1822    Tegeler, Canary Brim, MD 08/24/16 [phone]

## 2016-08-28 ENCOUNTER — Other Ambulatory Visit: Payer: Self-pay | Admitting: Family Medicine

## 2016-08-28 ENCOUNTER — Ambulatory Visit
Admission: RE | Admit: 2016-08-28 | Discharge: 2016-08-28 | Disposition: A | Discharge status: Home / Self Care | Payer: BLUE CROSS/BLUE SHIELD | Source: Ambulatory Visit | Attending: Family Medicine | Admitting: Family Medicine

## 2016-08-28 DIAGNOSIS — M545 Low back pain, unspecified: Secondary | ICD-10-CM

## 2016-08-28 DIAGNOSIS — K5909 Other constipation: Secondary | ICD-10-CM | POA: Diagnosis not present

## 2016-11-07 ENCOUNTER — Ambulatory Visit: Payer: BLUE CROSS/BLUE SHIELD | Admitting: Nurse Practitioner

## 2016-11-07 ENCOUNTER — Encounter: Payer: Self-pay | Admitting: Certified Nurse Midwife

## 2016-11-07 ENCOUNTER — Ambulatory Visit: Payer: BLUE CROSS/BLUE SHIELD | Admitting: Certified Nurse Midwife

## 2016-11-07 VITALS — BP 122/84 | HR 70 | Resp 16 | Ht 63.25 in | Wt 197.0 lb

## 2016-11-07 DIAGNOSIS — Z129 Encounter for screening for malignant neoplasm, site unspecified: Secondary | ICD-10-CM

## 2016-11-07 DIAGNOSIS — Z1211 Encounter for screening for malignant neoplasm of colon: Secondary | ICD-10-CM | POA: Diagnosis not present

## 2016-11-07 DIAGNOSIS — Z803 Family history of malignant neoplasm of breast: Secondary | ICD-10-CM | POA: Diagnosis not present

## 2016-11-07 DIAGNOSIS — Z01419 Encounter for gynecological examination (general) (routine) without abnormal findings: Secondary | ICD-10-CM

## 2016-11-07 NOTE — Patient Instructions (Signed)

## 2016-11-07 NOTE — Progress Notes (Signed)
51 y.o. G6P2002 Married  Caucasian Fe here for annual exam.  No menopausal symptoms.Has had a trying summer with back issues, which is better and dental issues. Sees PCP at least 4 times a year for insomnina and allergy management and labs. Has not had mammogram since 2015 and probably will not schedule this year. Aware of recommendation, just feel not needed. No health issues today..  Patient's last menstrual period was 03/15/1998 (approximate).          Sexually active: Yes.    The current method of family planning is status post hysterectomy.    Exercising: No.  exercise Smoker:  no  Health Maintenance: Pap:  8/00 neg, 11-07-15 neg HPV HR neg History of Abnormal Pap: no MMG:  02-26-13 category b density birads 1:neg Self Breast exams: yes Colonoscopy:  none BMD:   none TDaP:  Not update declines today. Shingles: no Pneumonia: no Hep C and HIV: not done Labs: none today with PCP   reports that she has never smoked. She has never used smokeless tobacco. She reports that she does not drink alcohol or use drugs.  Past Medical History:  Diagnosis Date  . Depression   . Gastritis    related to NSAID use    Past Surgical History:  Procedure Laterality Date  . ABDOMINAL HYSTERECTOMY  03/1998   LSO,RT. Cystectomy  . AUGMENTATION MAMMAPLASTY Bilateral 1996   breast lift  . CESAREAN SECTION  1990, 1993   x 2  . TONSILLECTOMY      Current Outpatient Prescriptions  Medication Sig Dispense Refill  . cyclobenzaprine (FLEXERIL) 10 MG tablet Take 10 mg by mouth 3 (three) times daily as needed for muscle spasms. Neck and shoulder pain    . docusate sodium (COLACE) 250 MG capsule Take 1 capsule (250 mg total) by mouth daily. 14 capsule 0  . fexofenadine (ALLEGRA) 180 MG tablet Take 1 tablet (180 mg total) by mouth 2 (two) times daily as needed for allergies or rhinitis. 60 tablet 3  . hydrOXYzine (ATARAX/VISTARIL) 25 MG tablet Take 25 mg by mouth every 6 (six) hours as needed for anxiety  or itching.   2  . nabumetone (RELAFEN) 500 MG tablet Take 500 mg by mouth 2 (two) times daily as needed for moderate pain. Ulcer    . polyethylene glycol (MIRALAX) packet Take 17 g by mouth daily. 14 each 0  . ranitidine (ZANTAC) 150 MG tablet Take 1 tablet (150 mg total) by mouth 2 (two) times daily. 60 tablet 3  . temazepam (RESTORIL) 30 MG capsule Take 30 mg by mouth at bedtime.     . traMADol (ULTRAM) 50 MG tablet Take 50 mg by mouth at bedtime.      No current facility-administered medications for this visit.     Family History  Problem Relation Age of Onset  . Hypertension Mother   . Diabetes Mother   . Osteoporosis Mother   . Cancer Maternal Grandfather   . Breast cancer Maternal Aunt        50ish and 70ish  . Allergic rhinitis Neg Hx   . Angioedema Neg Hx   . Asthma Neg Hx   . Eczema Neg Hx     ROS:  Pertinent items are noted in HPI.  Otherwise, a comprehensive ROS was negative.  Exam:   BP 122/84   Pulse 70   Resp 16   Ht 5' 3.25" (1.607 m)   Wt 197 lb (89.4 kg)   LMP 03/15/1998 (Approximate)  BMI 34.62 kg/m  Height: 5' 3.25" (160.7 cm) Ht Readings from Last 3 Encounters:  11/07/16 5' 3.25" (1.607 m)  08/02/16 5' 3.5" (1.613 m)  11/07/15 5' 3.5" (1.613 m)    General appearance: alert, cooperative and appears stated age Head: Normocephalic, without obvious abnormality, atraumatic Neck: no adenopathy, supple, symmetrical, trachea midline and thyroid normal to inspection and palpation Lungs: clear to auscultation bilaterally Breasts: normal appearance, no masses or tenderness, No nipple retraction or dimpling, No nipple discharge or bleeding, No axillary or supraclavicular adenopathy Heart: regular rate and rhythm Abdomen: soft, non-tender; no masses,  no organomegaly Extremities: extremities normal, atraumatic, no cyanosis or edema Skin: Skin color, texture, turgor normal. No rashes or lesions Lymph nodes: Cervical, supraclavicular, and axillary nodes  normal. No abnormal inguinal nodes palpated Neurologic: Grossly normal   Pelvic: External genitalia:  no lesions              Urethra:  normal appearing urethra with no masses, tenderness or lesions              Bartholin's and Skene's: normal                 Vagina: normal appearing vagina with normal color and discharge, no lesions              Cervix: absent              Pap taken: No. Bimanual Exam:  Uterus:  uterus absent              Adnexa: normal adnexa, no mass, fullness, tenderness and LSO surgically absent               Rectovaginal: Confirms               Anus:  normal sphincter tone, no lesions  Chaperone present: yes  A:  Well Woman with normal exam  TAH with LSO for bleeding per patient  Mammogram overdue, declines scheduling  Insomina, anxiety with PCP management  Colonoscopy due declines scheduling today, will call when ready  Family history of breast cancer MA 50,70, ? BRACA negative    P:   Reviewed health and wellness pertinent to exam  Discussed importance of SBE and mammogram with family history of breast cancer. Patient aware, will schedule at "some point".  Continue follow up with PCP as indicated  IFOB dispensed . Reviewed benefits of colonoscopy, declines scheduling.  Pap smear: no   counseled on breast self exam, mammography screening, feminine hygiene, menopause, adequate intake of calcium and vitamin D, diet and exercise  return annually or prn  An After Visit Summary was printed and given to the patient.

## 2016-11-15 LAB — FECAL OCCULT BLOOD, IMMUNOCHEMICAL: IFOBT: NEGATIVE

## 2016-12-25 ENCOUNTER — Other Ambulatory Visit: Payer: Self-pay | Admitting: Certified Nurse Midwife

## 2016-12-25 DIAGNOSIS — Z1231 Encounter for screening mammogram for malignant neoplasm of breast: Secondary | ICD-10-CM

## 2016-12-27 ENCOUNTER — Ambulatory Visit
Admission: RE | Admit: 2016-12-27 | Discharge: 2016-12-27 | Disposition: A | Discharge status: Home / Self Care | Payer: BLUE CROSS/BLUE SHIELD | Source: Ambulatory Visit | Attending: Certified Nurse Midwife | Admitting: Certified Nurse Midwife

## 2016-12-27 DIAGNOSIS — Z1231 Encounter for screening mammogram for malignant neoplasm of breast: Secondary | ICD-10-CM

## 2017-02-07 DIAGNOSIS — N390 Urinary tract infection, site not specified: Secondary | ICD-10-CM | POA: Diagnosis not present

## 2017-02-13 DIAGNOSIS — N3000 Acute cystitis without hematuria: Secondary | ICD-10-CM | POA: Diagnosis not present

## 2017-02-13 DIAGNOSIS — H6123 Impacted cerumen, bilateral: Secondary | ICD-10-CM | POA: Diagnosis not present

## 2017-03-08 ENCOUNTER — Telehealth: Payer: Self-pay | Admitting: Certified Nurse Midwife

## 2017-03-08 NOTE — Telephone Encounter (Signed)
Spoke with patient, requesting OV on 1/28 for yeast. Patient reports she was treated by PCP on 02/13/17 for UTI. Developed yeast, treated with diflucan. States yeast symptoms did not fully resolve, treated with OTC monistat 1 day on 03/03/17 with no relief.   Reports external burning with urination when urine touches skin.   Denies any other symptoms.   Patient aware Leota SauersDeborah Leonard, CNM is out of the office on Mondays, request to schedule with any provider. OV scheduled for 1/28 at 2:30pm with Dr. Oscar LaJertson.  Patient asking what she can do for relief? Advised may apply coconut oil prn. Seek care at local urgent care if symptoms become worse or new symptoms develop over the weekend. Patient verbalizes understanding and is agreeable.   Routing to provider for final review. Patient is agreeable to disposition. Will close encounter.

## 2017-03-08 NOTE — Telephone Encounter (Signed)
Patient thinks she has a UTI or yeast infection.  Symptoms include burning with urination.  No fever, rash, nausea or vomiting.

## 2017-03-11 ENCOUNTER — Encounter: Payer: Self-pay | Admitting: Obstetrics and Gynecology

## 2017-03-11 ENCOUNTER — Other Ambulatory Visit: Payer: Self-pay

## 2017-03-11 ENCOUNTER — Ambulatory Visit: Payer: BLUE CROSS/BLUE SHIELD | Admitting: Obstetrics and Gynecology

## 2017-03-11 VITALS — BP 138/86 | HR 86 | Resp 14 | Wt 198.0 lb

## 2017-03-11 DIAGNOSIS — R350 Frequency of micturition: Secondary | ICD-10-CM | POA: Diagnosis not present

## 2017-03-11 DIAGNOSIS — N762 Acute vulvitis: Secondary | ICD-10-CM

## 2017-03-11 DIAGNOSIS — R3915 Urgency of urination: Secondary | ICD-10-CM

## 2017-03-11 MED ORDER — FOSFOMYCIN TROMETHAMINE 3 G PO PACK: 3.0000 g | PACK | Freq: Once | ORAL | 0 refills | Status: AC

## 2017-03-11 MED ORDER — PHENAZOPYRIDINE HCL 200 MG PO TABS: 200.0000 mg | ORAL_TABLET | Freq: Three times a day (TID) | ORAL | 0 refills | Status: DC | PRN

## 2017-03-11 MED ORDER — BETAMETHASONE VALERATE 0.1 % EX OINT: TOPICAL_OINTMENT | CUTANEOUS | 0 refills | Status: DC

## 2017-03-11 NOTE — Progress Notes (Signed)
GYNECOLOGY  VISIT   HPI: 52 y.o.   Married  Caucasian  female   G2P2002 with Patient's last menstrual period was 03/15/1998 (approximate).   here for vulvar burning.   At the end of December she was treated for a e.coli UTI, she had bad reactions to Macrobid and Augmentin, did fine with Keflex. She started having vulvar burning, feels sunburned on the vulva. No itching, no vaginal d/c. She took one dose of diflucan 2 weeks ago, felt better for a few days, then symptoms restarted. She took OTC monistat 8 days ago. Symptoms improved for a few days and then came back.  She then started having urinary frequency and urgency 3 days ago. Only burns externally. Urine smells, particularly in the am. No fever, no flank pain.  She has been taking azo since Friday, getting less effective.   GYNECOLOGIC HISTORY: Patient's last menstrual period was 03/15/1998 (approximate). Contraception:Hysterectomy Menopausal hormone therapy: none        OB History    Gravida Para Term Preterm AB Living   2 2 2  0 0 2   SAB TAB Ectopic Multiple Live Births   0 0 0 0 2         Patient Active Problem List   Diagnosis Date Noted  . Endometriosis 12/25/2012    Past Medical History:  Diagnosis Date  . Depression   . Gastritis    related to NSAID use    Past Surgical History:  Procedure Laterality Date  . ABDOMINAL HYSTERECTOMY  03/1998   LSO,RT. Cystectomy  . AUGMENTATION MAMMAPLASTY Bilateral 1996   breast lift  . CESAREAN SECTION  1990, 1993   x 2  . TONSILLECTOMY      Current Outpatient Medications  Medication Sig Dispense Refill  . cyclobenzaprine (FLEXERIL) 10 MG tablet Take 10 mg by mouth 3 (three) times daily as needed for muscle spasms. Neck and shoulder pain    . docusate sodium (COLACE) 250 MG capsule Take 1 capsule (250 mg total) by mouth daily. 14 capsule 0  . fexofenadine (ALLEGRA) 180 MG tablet Take 1 tablet (180 mg total) by mouth 2 (two) times daily as needed for allergies or rhinitis.  60 tablet 3  . hydrOXYzine (ATARAX/VISTARIL) 25 MG tablet Take 25 mg by mouth every 6 (six) hours as needed for anxiety or itching.   2  . nabumetone (RELAFEN) 500 MG tablet Take 500 mg by mouth 2 (two) times daily as needed for moderate pain. Ulcer    . polyethylene glycol (MIRALAX) packet Take 17 g by mouth daily. 14 each 0  . ranitidine (ZANTAC) 150 MG tablet Take 1 tablet (150 mg total) by mouth 2 (two) times daily. 60 tablet 3  . temazepam (RESTORIL) 30 MG capsule Take 30 mg by mouth at bedtime.     . traMADol (ULTRAM) 50 MG tablet Take 50 mg by mouth at bedtime.      No current facility-administered medications for this visit.      ALLERGIES: Augmentin [amoxicillin-pot clavulanate]; Hydrocodone; Macrobid [nitrofurantoin macrocrystal]; and Sulfa antibiotics  Family History  Problem Relation Age of Onset  . Hypertension Mother   . Diabetes Mother   . Osteoporosis Mother   . Cancer Maternal Grandfather   . Breast cancer Maternal Aunt        50ish and 70ish  . Allergic rhinitis Neg Hx   . Angioedema Neg Hx   . Asthma Neg Hx   . Eczema Neg Hx     Social History  Socioeconomic History  . Marital status: Married    Spouse name: Not on file  . Number of children: Not on file  . Years of education: Not on file  . Highest education level: Not on file  Social Needs  . Financial resource strain: Not on file  . Food insecurity - worry: Not on file  . Food insecurity - inability: Not on file  . Transportation needs - medical: Not on file  . Transportation needs - non-medical: Not on file  Occupational History  . Not on file  Tobacco Use  . Smoking status: Never Smoker  . Smokeless tobacco: Never Used  Substance and Sexual Activity  . Alcohol use: No    Alcohol/week: 0.0 oz  . Drug use: No  . Sexual activity: Yes    Partners: Male    Birth control/protection: Surgical    Comment: Hysterectomy  Other Topics Concern  . Not on file  Social History Narrative  . Not on  file    Review of Systems  Constitutional: Negative.   HENT: Negative.   Eyes: Negative.   Genitourinary: Positive for dysuria, frequency and urgency.       Vaginal irritation/itching  Musculoskeletal: Negative.   Skin: Negative.   Neurological: Negative.   Endo/Heme/Allergies: Negative.   Psychiatric/Behavioral: Negative.     PHYSICAL EXAMINATION:    BP 138/86 (BP Location: Right Arm, Patient Position: Sitting, Cuff Size: Normal)   Pulse 86   Resp 14   Wt 198 lb (89.8 kg)   LMP 03/15/1998 (Approximate)   BMI 34.80 kg/m     General appearance: alert, cooperative and appears stated age Abdomen: soft, non-tender; non distended, no masses,  no organomegaly CVA: not tender  Pelvic: External genitalia:  no lesions              Urethra:  normal appearing urethra with no masses, tenderness or lesions              Bartholins and Skenes: normal                 Vagina: normal appearing vagina with an increase in frothy, white vaginal d/c, slight atrophy              Cervix: absent              Bimanual Exam:  Uterus:  uterus absent              Adnexa: no mass, fullness, tenderness              Bladder: mildly tender  Chaperone was present for exam.  Wet prep: ? clue, no trich, few wbc KOH: no yeast PH: 4   ASSESSMENT Vaginal d/c UTI symptoms (took pyridium, can't dip her urine)    PLAN Affirm Fosfomycin Pyridium Send urine for ua, c&s (with sensitivities to fosfomycin)   An After Visit Summary was printed and given to the patient.

## 2017-03-12 ENCOUNTER — Telehealth: Payer: Self-pay | Admitting: Certified Nurse Midwife

## 2017-03-12 LAB — URINALYSIS, MICROSCOPIC ONLY: CASTS: NONE SEEN /LPF

## 2017-03-12 LAB — VAGINITIS/VAGINOSIS, DNA PROBE
Candida Species: NEGATIVE
Gardnerella vaginalis: NEGATIVE
Trichomonas vaginosis: NEGATIVE

## 2017-03-12 NOTE — Telephone Encounter (Signed)
Patient called and requested a prior authorization for an antibiotic (? Name) she was prescribed by Dr. Oscar LaJertson yesterday, 03/11/17. She said she'd like the prior authorization done today, if possible, to get a credit back from her pharmacy. She requested I document that she had to pay $90.00 for the prescription today but that the pharmacy would refund money if the authorization came through today.  Pharmacy on file confirmed.

## 2017-03-13 NOTE — Telephone Encounter (Signed)
[facility]

## 2017-03-14 LAB — URINE CULTURE

## 2017-03-14 NOTE — Telephone Encounter (Signed)
Spoke with patient and informed that PA has been approved -eh

## 2017-03-15 ENCOUNTER — Telehealth: Payer: Self-pay | Admitting: Certified Nurse Midwife

## 2017-03-15 MED ORDER — CIPROFLOXACIN HCL 250 MG PO TABS: 250.0000 mg | ORAL_TABLET | Freq: Two times a day (BID) | ORAL | 0 refills | Status: DC

## 2017-03-15 NOTE — Telephone Encounter (Signed)
Please let her know that her sensitivities to the fosfomycin are still pending (it shouldn't take this long, we are discussing this with the lab).  Her UTI is sensitive to cipro. Please call in ciprofloxacin 250 mg BID x 3 days.  This is not going to help with external burning. If she has fevers or flank pain she needs to be seen for possible pyelonephritis.  Ask if the steroid ointment is helping her burning.

## 2017-03-15 NOTE — Telephone Encounter (Signed)
Patient called and stated her urinary tract infection symptoms are not better. She declined an appointment and requested to speak with the nurse.

## 2017-03-15 NOTE — Telephone Encounter (Signed)
Spoke with patient, advised as seen below per Dr. Oscar LaJertson. Cipro Rx to verified pharmacy on file.   Steroid ointment provided some relief.   ER precaution reviewed.   Patient verbalizes understanding and is agreeable.

## 2017-03-15 NOTE — Telephone Encounter (Signed)
Spoke with patient, states urinary symptoms have not resolved since taking fosfomycin on 1/29.  Reports urgency, external burning, nausea and chills.   Denies fever, lower back pain, blood in urine.   Using valisone ointment externally, provides some relief.   Recommended OV for further evaluation. Patient declined, patient states this has been ongoing since December with no resolve. Patient states she has not been called with culture results and is requesting alternative antibiotic.   Advised patient will review with Dr. Oscar LaJertson and return call, patient is agreeable.   Dr. Oscar LaJertson -please review and advise?

## 2017-03-18 ENCOUNTER — Telehealth: Payer: Self-pay | Admitting: Obstetrics and Gynecology

## 2017-03-18 LAB — SPECIMEN STATUS REPORT

## 2017-03-18 LAB — MIN INHIBITORY CONC (1 DRUG)

## 2017-03-18 NOTE — Telephone Encounter (Signed)
Reviewed with Dr. Oscar LaJertson, recommended OV for further evaluation. Call returned to patient, OV scheduled for 03/19/17 at 3:30pm with Dr. Oscar LaJertson. Patient verbalizes understanding and is agreeable.   Routing to provider for final review. Patient is agreeable to disposition. Will close encounter.

## 2017-03-18 NOTE — Telephone Encounter (Signed)
Spoke with patient. Calling for lab results.  Completed Cipro on 2/3, urinary urgency and constant burning at urethra have not resolved. Does not burn when voiding.   Denies lower back pain, fever/chills, N/V.   Using valisone, vulvar burning has improved.   Advised patient will review with Dr. Oscar LaJertson and return call with recommendations. Patient verbalizes understanding and is agreeable.    Dr. Oscar LaJertson -please review and advise?

## 2017-03-18 NOTE — Telephone Encounter (Signed)
Patient called for her recent results. She said her symptoms have not gone away completely.

## 2017-03-19 ENCOUNTER — Telehealth: Payer: Self-pay | Admitting: Obstetrics and Gynecology

## 2017-03-19 ENCOUNTER — Ambulatory Visit: Payer: Self-pay | Admitting: Obstetrics and Gynecology

## 2017-03-19 NOTE — Telephone Encounter (Signed)
Patient canceled her appointment for uti symptoms today. Patient states she no longer has uti symptoms and declined to reschedule.

## 2017-04-01 ENCOUNTER — Encounter: Payer: Self-pay | Admitting: Obstetrics and Gynecology

## 2017-04-01 ENCOUNTER — Other Ambulatory Visit: Payer: Self-pay

## 2017-04-01 ENCOUNTER — Ambulatory Visit: Payer: BLUE CROSS/BLUE SHIELD | Admitting: Obstetrics and Gynecology

## 2017-04-01 VITALS — BP 116/70 | HR 92 | Temp 98.1°F | Resp 16 | Wt 196.0 lb

## 2017-04-01 DIAGNOSIS — N390 Urinary tract infection, site not specified: Secondary | ICD-10-CM | POA: Diagnosis not present

## 2017-04-01 LAB — POCT URINALYSIS DIPSTICK
BILIRUBIN UA: NEGATIVE
GLUCOSE UA: NEGATIVE
Ketones, UA: NEGATIVE
Nitrite, UA: NEGATIVE
Protein, UA: NEGATIVE
Urobilinogen, UA: 0.2 E.U./dL
pH, UA: 8 (ref 5.0–8.0)

## 2017-04-01 MED ORDER — CIPROFLOXACIN HCL 500 MG PO TABS: 500.0000 mg | ORAL_TABLET | Freq: Two times a day (BID) | ORAL | 0 refills | Status: DC

## 2017-04-01 NOTE — Progress Notes (Signed)
GYNECOLOGY  VISIT   HPI: 52 y.o.   Married  Caucasian  female   G2P2002 with Patient's last menstrual period was 03/15/1998 (approximate).   here for  Recurrent UTI.  No dysuria.  Feels urgency and frequency to void.  No hematuria.  No fever.  Having chills at hs. Little bid of nausea. No vomiting.  No back pain.   Cranberry supplements help.  Trying to drink a lot of water.  Symptoms started New year's Eve weekend.  Tx with Macrobid and developed musculoskeletal pain.  Switched to Augmentin and had severe vomiting.  Then tx with Keflex and symptoms resolved temporarily.  10 days later her symptoms returned and saw Dr. Oscar LaJertson.  UC showed E Coli and treated with fosfomycin for 1 day, and then tx with Cipro 250 mg po bid x 3 days at the end of January 2018.  She did treat with Diflucan and valisone for vulvar burning following the abx regimen.   No change in sexual partner.  No post coital UTIs.  External vaginal dryness.   No vaginal discharge or odor.  Urine does smell foul per patient.   Urine - trace blood, trace leukocytes.   GYNECOLOGIC HISTORY: Patient's last menstrual period was 03/15/1998 (approximate). Contraception:  Hysterectomy  Menopausal hormone therapy:  none Last mammogram:  12/27/16 BIRADS 1 negative/density b Last pap smear:   11/07/15 Pap and HR HPV negative        OB History    Gravida Para Term Preterm AB Living   2 2 2  0 0 2   SAB TAB Ectopic Multiple Live Births   0 0 0 0 2         Patient Active Problem List   Diagnosis Date Noted  . Endometriosis 12/25/2012    Past Medical History:  Diagnosis Date  . Depression   . Gastritis    related to NSAID use    Past Surgical History:  Procedure Laterality Date  . ABDOMINAL HYSTERECTOMY  03/1998   LSO,RT. Cystectomy  . AUGMENTATION MAMMAPLASTY Bilateral 1996   breast lift  . CESAREAN SECTION  1990, 1993   x 2  . TONSILLECTOMY      Current Outpatient Medications  Medication Sig  Dispense Refill  . cyclobenzaprine (FLEXERIL) 10 MG tablet Take 10 mg by mouth 3 (three) times daily as needed for muscle spasms. Neck and shoulder pain    . docusate sodium (COLACE) 250 MG capsule Take 1 capsule (250 mg total) by mouth daily. 14 capsule 0  . fexofenadine (ALLEGRA) 180 MG tablet Take 1 tablet (180 mg total) by mouth 2 (two) times daily as needed for allergies or rhinitis. 60 tablet 3  . hydrOXYzine (ATARAX/VISTARIL) 25 MG tablet Take 25 mg by mouth every 6 (six) hours as needed for anxiety or itching.   2  . nabumetone (RELAFEN) 500 MG tablet Take 500 mg by mouth 2 (two) times daily as needed for moderate pain. Ulcer    . ranitidine (ZANTAC) 150 MG tablet Take 1 tablet (150 mg total) by mouth 2 (two) times daily. 60 tablet 3  . temazepam (RESTORIL) 30 MG capsule Take 30 mg by mouth at bedtime.     . traMADol (ULTRAM) 50 MG tablet Take 50 mg by mouth at bedtime.      No current facility-administered medications for this visit.      ALLERGIES: Augmentin [amoxicillin-pot clavulanate]; Hydrocodone; Macrobid [nitrofurantoin macrocrystal]; and Sulfa antibiotics  Family History  Problem Relation Age of Onset  .  Hypertension Mother   . Diabetes Mother   . Osteoporosis Mother   . Cancer Maternal Grandfather   . Breast cancer Maternal Aunt        50ish and 70ish  . Allergic rhinitis Neg Hx   . Angioedema Neg Hx   . Asthma Neg Hx   . Eczema Neg Hx     Social History   Socioeconomic History  . Marital status: Married    Spouse name: Not on file  . Number of children: Not on file  . Years of education: Not on file  . Highest education level: Not on file  Social Needs  . Financial resource strain: Not on file  . Food insecurity - worry: Not on file  . Food insecurity - inability: Not on file  . Transportation needs - medical: Not on file  . Transportation needs - non-medical: Not on file  Occupational History  . Not on file  Tobacco Use  . Smoking status: Never Smoker   . Smokeless tobacco: Never Used  Substance and Sexual Activity  . Alcohol use: No    Alcohol/week: 0.0 oz  . Drug use: No  . Sexual activity: Yes    Partners: Male    Birth control/protection: Surgical    Comment: Hysterectomy  Other Topics Concern  . Not on file  Social History Narrative  . Not on file    ROS:  Pertinent items are noted in HPI.  PHYSICAL EXAMINATION:    BP 116/70 (BP Location: Right Arm, Patient Position: Sitting, Cuff Size: Large)   Pulse 92   Temp 98.1 F (36.7 C) (Oral)   Resp 16   Wt 196 lb (88.9 kg)   LMP 03/15/1998 (Approximate)   BMI 34.45 kg/m     General appearance: alert, cooperative and appears stated age  Pelvic: External genitalia:  no lesions              Urethra:  normal appearing urethra with no masses, tenderness or lesions              Bartholins and Skenes: normal                 Vagina: normal appearing vagina with normal color and discharge, no lesions              Cervix:  Absent.                Bimanual Exam:  Uterus:   Absent.              Adnexa: no mass, fullness, tenderness             Chaperone was present for exam.  ASSESSMENT  Persistent UTI.  E Coli.  PLAN  Urine micro and culture.  Ciprofloxacin 500 mg po bid x 7 days.  Call for worsening symptoms.  I did discuss risk of enterocolitis and tendinitis.  Use probiotics.    An After Visit Summary was printed and given to the patient.  __15____ minutes face to face time of which over 50% was spent in counseling.

## 2017-04-01 NOTE — Addendum Note (Signed)
Addended by: Zenovia JordanMITCHELL, Banner Huckaba A on: 04/01/2017 12:59 PM   Modules accepted: Orders

## 2017-04-01 NOTE — Patient Instructions (Signed)

## 2017-04-03 LAB — URINALYSIS, MICROSCOPIC ONLY: Casts: NONE SEEN /lpf

## 2017-04-03 LAB — URINE CULTURE

## 2017-04-04 ENCOUNTER — Telehealth: Payer: Self-pay | Admitting: Obstetrics and Gynecology

## 2017-04-04 NOTE — Telephone Encounter (Signed)
Spoke with patient. Reports she is on day 4 of Cipro, no change in symptoms. Symptoms have not worsened, is concerned no improvement.   Reports constant urgency and pinching feeling on outside of urethra.   Chills, took temp this morning, "98 something". Intermittent nausea, is unsure if r/t cipro.   Taking uricalm bid and cranberry supplement daily.   Patient asking if results are back and recommendations?   Advised with review with Dr. Edward JollySilva and return call, patient is agreeable.   Dr. Edward JollySilva -please review and advise?

## 2017-04-04 NOTE — Telephone Encounter (Signed)
Urine culture is negative but showed a lot of WBCs.  I would like for her to have a recheck with me tomorrow. 8:15? She needs a cath spec urinalysis and a cervicitis check.  Have her stop taking any medication that may turn her urine another color.

## 2017-04-04 NOTE — Telephone Encounter (Signed)
Spoke with patient, advised as seen below per Dr. Edward JollySilva. Advised patient to stop Cipro. OV scheduled with Dr. Edward JollySilva on 2/22 at 8:15am. Patient verbalizes understanding and is agreeable.   Routing to provider for final review. Patient is agreeable to disposition. Will close encounter.

## 2017-04-04 NOTE — Telephone Encounter (Signed)
Patient states she was seen on Monday 04/01/17 for a UTI and is still having the same symptoms. Patient is asking if her lab results are available from her last appointment  Forwarding to Triage Nurse

## 2017-04-05 ENCOUNTER — Ambulatory Visit: Payer: BLUE CROSS/BLUE SHIELD | Admitting: Obstetrics and Gynecology

## 2017-04-05 ENCOUNTER — Other Ambulatory Visit: Payer: Self-pay

## 2017-04-05 ENCOUNTER — Encounter: Payer: Self-pay | Admitting: Obstetrics and Gynecology

## 2017-04-05 VITALS — BP 112/62 | HR 68 | Temp 97.7°F | Resp 16 | Wt 196.0 lb

## 2017-04-05 DIAGNOSIS — R3989 Other symptoms and signs involving the genitourinary system: Secondary | ICD-10-CM | POA: Diagnosis not present

## 2017-04-05 DIAGNOSIS — N951 Menopausal and female climacteric states: Secondary | ICD-10-CM

## 2017-04-05 DIAGNOSIS — N898 Other specified noninflammatory disorders of vagina: Secondary | ICD-10-CM

## 2017-04-05 MED ORDER — LIDOCAINE HCL 2 % EX GEL: 1.0000 "application " | Freq: Three times a day (TID) | CUTANEOUS | 0 refills | Status: DC | PRN

## 2017-04-05 NOTE — Progress Notes (Signed)
GYNECOLOGY  VISIT   HPI: 52 y.o.   Married  Caucasian  female   G2P2002 with Patient's last menstrual period was 03/15/1998 (approximate).   here for   Urinary symptoms. States her symptoms are pinching of the urethra that causes a sense of urgency.  Feels like her symptoms worsen during the day.   Tx with Cipro for 4 days and then stopped upon my recommendation as UC is negative. Urine micro had a lot of WBCs.   Did have an E Coli UTI 03/11/17.  Hx endometriosis.   Some hot flashes?  GYNECOLOGIC HISTORY: Patient's last menstrual period was 03/15/1998 (approximate). Contraception:  Hysterectomy Menopausal hormone therapy:  none Last mammogram:  12/27/16 BIRADS 1 negative/density b Last pap smear:   11/07/15 Pap and HR HPV negative        OB History    Gravida Para Term Preterm AB Living   2 2 2  0 0 2   SAB TAB Ectopic Multiple Live Births   0 0 0 0 2         Patient Active Problem List   Diagnosis Date Noted  . Endometriosis 12/25/2012    Past Medical History:  Diagnosis Date  . Depression   . Gastritis    related to NSAID use    Past Surgical History:  Procedure Laterality Date  . ABDOMINAL HYSTERECTOMY  03/1998   LSO,RT. Cystectomy  . AUGMENTATION MAMMAPLASTY Bilateral 1996   breast lift  . [place]   x 2  . TONSILLECTOMY      Current Outpatient Medications  Medication Sig Dispense Refill  . ciprofloxacin (CIPRO) 500 MG tablet Take 1 tablet (500 mg total) by mouth 2 (two) times daily. 14 tablet 0  . cyclobenzaprine (FLEXERIL) 10 MG tablet Take 10 mg by mouth 3 (three) times daily as needed for muscle spasms. Neck and shoulder pain    . docusate sodium (COLACE) 250 MG capsule Take 1 capsule (250 mg total) by mouth daily. 14 capsule 0  . fexofenadine (ALLEGRA) 180 MG tablet Take 1 tablet (180 mg total) by mouth 2 (two) times daily as needed for allergies or rhinitis. 60 tablet 3  . hydrOXYzine (ATARAX/VISTARIL) 25 MG tablet Take 25 mg by  mouth every 6 (six) hours as needed for anxiety or itching.   2  . nabumetone (RELAFEN) 500 MG tablet Take 500 mg by mouth 2 (two) times daily as needed for moderate pain. Ulcer    . ranitidine (ZANTAC) 150 MG tablet Take 1 tablet (150 mg total) by mouth 2 (two) times daily. 60 tablet 3  . temazepam (RESTORIL) 30 MG capsule Take 30 mg by mouth at bedtime.     . traMADol (ULTRAM) 50 MG tablet Take 50 mg by mouth at bedtime.      No current facility-administered medications for this visit.      ALLERGIES: Augmentin [amoxicillin-pot clavulanate]; Hydrocodone; Macrobid [nitrofurantoin macrocrystal]; and Sulfa antibiotics  Family History  Problem Relation Age of Onset  . Hypertension Mother   . Diabetes Mother   . Osteoporosis Mother   . Cancer Maternal Grandfather   . Breast cancer Maternal Aunt        50ish and 70ish  . Allergic rhinitis Neg Hx   . Angioedema Neg Hx   . Asthma Neg Hx   . Eczema Neg Hx     Social History   Socioeconomic History  . Marital status: Married    Spouse name: Not on file  .  Number of children: Not on file  . Years of education: Not on file  . Highest education level: Not on file  Social Needs  . Financial resource strain: Not on file  . Food insecurity - worry: Not on file  . Food insecurity - inability: Not on file  . Transportation needs - medical: Not on file  . Transportation needs - non-medical: Not on file  Occupational History  . Not on file  Tobacco Use  . Smoking status: Never Smoker  . Smokeless tobacco: Never Used  Substance and Sexual Activity  . Alcohol use: No    Alcohol/week: 0.0 oz  . Drug use: No  . Sexual activity: Yes    Partners: Male    Birth control/protection: Surgical    Comment: Hysterectomy  Other Topics Concern  . Not on file  Social History Narrative  . Not on file    ROS:  Pertinent items are noted in HPI.  PHYSICAL EXAMINATION:    LMP 03/15/1998 (Approximate)     General appearance: alert, cooperative  and appears stated age  Pelvic: External genitalia:  no lesions              Urethra:  normal appearing urethra with no masses, tenderness or lesions              Bartholins and Skenes: normal                 Vagina: 9 mm purple blue lesion of the left vaginal apex, somewhat firm.  Whitish discharge.              Cervix:  Absent.                 Bimanual Exam:  Uterus:   Absent.              Adnexa: no mass, fullness, tenderness              Rectal exam: Yes.  .  Confirms.              Anus:  normal sphincter tone, no lesions, no mass palpable.  Urine cath: Verbal consent. Betadine prep.  Cath kit used to obtain 12 cc yellow urine with small flocculant matter.  Chaperone was present for exam.  ASSESSMENT  Status post TAH/LSO/right cystectomy - endometriosis.  Vaginal lesion.  Possible endometriosis.  Vaginal discharge. Urethritis.  Menopausal symptoms.   PLAN  Nuswab for GC/CT/trich/BV, and yeast.  Urine micro and culture repeat.  Xylocaine jelly 2% tid to urethra prn pain. Return for colpo and biopsy.  Check FSH and estradiol.   An After Visit Summary was printed and given to the patient.  __25____ minutes face to face time of which over 50% was spent in counseling.

## 2017-04-06 LAB — URINALYSIS, MICROSCOPIC ONLY
Bacteria, UA: NONE SEEN
CASTS: NONE SEEN /LPF

## 2017-04-06 LAB — ESTRADIOL: Estradiol: 68.8 pg/mL

## 2017-04-06 LAB — FOLLICLE STIMULATING HORMONE: FSH: 42.2 m[IU]/mL

## 2017-04-06 LAB — URINE CULTURE: ORGANISM ID, BACTERIA: NO GROWTH

## 2017-04-09 ENCOUNTER — Telehealth: Payer: Self-pay | Admitting: Obstetrics and Gynecology

## 2017-04-09 NOTE — Telephone Encounter (Signed)
Call placed to patient in regards to scheduling a procedure.

## 2017-04-09 NOTE — Telephone Encounter (Signed)
Spoke with patient, questions answered regarding colposcopy. Advised colpo for further evaluation of vaginal lesion. Brief explanation of procedure provided. Patient prefers tylenol, advised to take 2 Tylenol with food and water one hour before procedure.  Procedure scheduled for 04/22/17 at 10am with Dr. Edward JollySilva.    Routing to provider for final review. Patient is agreeable to disposition. Will close encounter.  Cc: Soundra Pilonosa Davis

## 2017-04-09 NOTE — Telephone Encounter (Signed)
Patient has some questions before scheduling colpo. She wants to know if this is really necessary since she already knows what this is.

## 2017-04-10 LAB — NUSWAB VAGINITIS PLUS (VG+)
CHLAMYDIA TRACHOMATIS, NAA: NEGATIVE
Candida albicans, NAA: NEGATIVE
Candida glabrata, NAA: NEGATIVE
Neisseria gonorrhoeae, NAA: NEGATIVE
Trich vag by NAA: NEGATIVE

## 2017-04-18 DIAGNOSIS — E119 Type 2 diabetes mellitus without complications: Secondary | ICD-10-CM | POA: Diagnosis not present

## 2017-04-18 DIAGNOSIS — Z23 Encounter for immunization: Secondary | ICD-10-CM | POA: Diagnosis not present

## 2017-04-18 DIAGNOSIS — Z0000 Encounter for general adult medical examination without abnormal findings: Secondary | ICD-10-CM | POA: Diagnosis not present

## 2017-04-18 DIAGNOSIS — E7800 Pure hypercholesterolemia, unspecified: Secondary | ICD-10-CM | POA: Diagnosis not present

## 2017-04-22 ENCOUNTER — Other Ambulatory Visit: Payer: Self-pay

## 2017-04-22 ENCOUNTER — Ambulatory Visit (INDEPENDENT_AMBULATORY_CARE_PROVIDER_SITE_OTHER): Payer: BLUE CROSS/BLUE SHIELD | Admitting: Obstetrics and Gynecology

## 2017-04-22 ENCOUNTER — Encounter: Payer: Self-pay | Admitting: Obstetrics and Gynecology

## 2017-04-22 VITALS — BP 120/70 | HR 80 | Resp 16 | Wt 197.0 lb

## 2017-04-22 DIAGNOSIS — N898 Other specified noninflammatory disorders of vagina: Secondary | ICD-10-CM

## 2017-04-22 DIAGNOSIS — N80129 Deep endometriosis of ovary, unspecified ovary: Secondary | ICD-10-CM

## 2017-04-22 DIAGNOSIS — N809 Endometriosis, unspecified: Secondary | ICD-10-CM

## 2017-04-22 MED ORDER — LEVONORGEST-ETH ESTRAD 91-DAY 0.15-0.03 MG PO TABS: 1.0000 | ORAL_TABLET | Freq: Every day | ORAL | 0 refills | Status: DC

## 2017-04-22 NOTE — Patient Instructions (Addendum)
Please call if you develop fever, increasing pain, or heavy vaginal bleeding.   You may start your birth control pills at any time.   Ethinyl Estradiol; Levonorgestrel tablets What is this medicine? ETHINYL ESTRADIOL; LEVONORGESTREL (ETH in il es tra DYE ole; LEE voh nor jes trel) is an oral contraceptive. It combines two types of female hormones, an estrogen and a progestin. They are used to prevent ovulation and pregnancy. This medicine may be used for other purposes; ask your health care provider or pharmacist if you have questions. COMMON BRAND NAME(S): [place], [place], Amethia, Amethia Lo, [place], [place], [place], Aviane, Camrese, Camrese Lo, [place], [place], [address], [place], [place], [place], [place], [place], [place], [place], [place], [place], [place], [place], Levonorgestrel/Ethinyl Estradiol, [place], [place], [place], [place], [place], [place], [place], [place], [place], [place], [place], [place], [place], [place], [place], [place], [place], Debroah Baller What should I tell my health care provider before I take this medicine? They need to know if you have or ever had any of these conditions: -abnormal vaginal bleeding -blood vessel disease or blood clots -breast, cervical, endometrial, ovarian, liver, or uterine cancer -diabetes -gallbladder disease -heart disease or recent heart attack -high blood pressure -high cholesterol -kidney disease -liver disease -migraine headaches -stroke -systemic lupus erythematosus (SLE) -tobacco smoker -an unusual or allergic reaction to estrogens, progestins, other medicines, foods, dyes, or preservatives -pregnant or trying to get pregnant -breast-feeding How should I use this medicine? Take this medicine by mouth. To reduce nausea, this medicine may be taken with food. Follow the directions on the prescription label. Take this medicine at the same time each day and in the order directed on the package. Do not take  your medicine more often than directed. Contact your pediatrician regarding the use of this medicine in children. Special care may be needed. This medicine has been used in female children who have started having menstrual periods. A patient package insert for the product will be given with each prescription and refill. Read this sheet carefully each time. The sheet may change frequently. Overdosage: If you think you have taken too much of this medicine contact a poison control center or emergency room at once. NOTE: This medicine is only for you. Do not share this medicine with others. What if I miss a dose? If you miss a dose, refer to the patient information sheet you received with your medicine for direction. If you miss more than one pill, this medicine may not be as effective and you may need to use another form of birth control. What may interact with this medicine? Do not take this medicine with the following medication: -dasabuvir; ombitasvir; paritaprevir; ritonavir -ombitasvir; paritaprevir; ritonavir This medicine may also interact with the following medications: -acetaminophen -antibiotics or medicines for infections, especially rifampin, rifabutin, rifapentine, and griseofulvin, and possibly penicillins or tetracyclines -aprepitant -ascorbic acid (vitamin C) -atorvastatin -barbiturate medicines, such as phenobarbital -bosentan -carbamazepine -caffeine -clofibrate -cyclosporine -dantrolene -doxercalciferol -felbamate -grapefruit juice -hydrocortisone -medicines for anxiety or sleeping problems, such as diazepam or temazepam -medicines for diabetes, including pioglitazone -mineral oil -modafinil -mycophenolate -nefazodone -oxcarbazepine -phenytoin -prednisolone -ritonavir or other medicines for HIV infection or AIDS -rosuvastatin -selegiline -soy isoflavones supplements -St. John's wort -tamoxifen or raloxifene -theophylline -thyroid  hormones -topiramate -warfarin This list may not describe all possible interactions. Give your health care provider a list of all the medicines, herbs, non-prescription drugs, or dietary supplements you use. Also tell them if you smoke, drink alcohol, or use illegal drugs. Some items may interact with your medicine. What should I watch  for while using this medicine? Visit your doctor or health care professional for regular checks on your progress. You will need a regular breast and pelvic exam and Pap smear while on this medicine. Use an additional method of contraception during the first cycle that you take these tablets. If you have any reason to think you are pregnant, stop taking this medicine right away and contact your doctor or health care professional. If you are taking this medicine for hormone related problems, it may take several cycles of use to see improvement in your condition. Smoking increases the risk of getting a blood clot or having a stroke while you are taking birth control pills, especially if you are more than 51 years old. You are strongly advised not to smoke. This medicine can make your body retain fluid, making your fingers, hands, or ankles swell. Your blood pressure can go up. Contact your doctor or health care professional if you feel you are retaining fluid. This medicine can make you more sensitive to the sun. Keep out of the sun. If you cannot avoid being in the sun, wear protective clothing and use sunscreen. Do not use sun lamps or tanning beds/booths. If you wear contact lenses and notice visual changes, or if the lenses begin to feel uncomfortable, consult your eye care specialist. In some women, tenderness, swelling, or minor bleeding of the gums may occur. Notify your dentist if this happens. Brushing and flossing your teeth regularly may help limit this. See your dentist regularly and inform your dentist of the medicines you are taking. If you are going to have  elective surgery, you may need to stop taking this medicine before the surgery. Consult your health care professional for advice. This medicine does not protect you against HIV infection (AIDS) or any other sexually transmitted diseases. What side effects may I notice from receiving this medicine? Side effects that you should report to your doctor or health care professional as soon as possible: -breast tissue changes or discharge -changes in vaginal bleeding during your period or between your periods -chest pain -coughing up blood -dizziness or fainting spells -headaches or migraines -leg, arm or groin pain -severe or sudden headaches -stomach pain (severe) -sudden shortness of breath -sudden loss of coordination, especially on one side of the body -speech problems -symptoms of vaginal infection like itching, irritation or unusual discharge -tenderness in the upper abdomen -vomiting -weakness or numbness in the arms or legs, especially on one side of the body -yellowing of the eyes or skin Side effects that usually do not require medical attention (report to your doctor or health care professional if they continue or are bothersome): -breakthrough bleeding and spotting that continues beyond the 3 initial cycles of pills -breast tenderness -mood changes, anxiety, depression, frustration, anger, or emotional outbursts -increased sensitivity to sun or ultraviolet light -nausea -skin rash, acne, or brown spots on the skin -weight gain (slight) This list may not describe all possible side effects. Call your doctor for medical advice about side effects. You may report side effects to FDA at 1-800-FDA-1088. Where should I keep my medicine? Keep out of the reach of children. Store at room temperature between 15 and 30 degrees C (59 and 86 degrees F). Throw away any unused medicine after the expiration date. NOTE: This sheet is a summary. It may not cover all possible information. If you  have questions about this medicine, talk to your doctor, pharmacist, or health care provider.  2018 Elsevier/Gold Standard (2015-10-10 07:58:22)

## 2017-04-22 NOTE — Progress Notes (Signed)
Subjective:     Patient ID: Erica Dennis, female   DOB: September 25, 1965, 52 y.o.   MRN: [id number]  HPI  Pap History: 11/07/15 Pap and HR HPV negative  Here for vaginal colpo and biopsy.   Labs showed E2 68.8 and FSH 42.2.  Had some urethral pain and was seen on 04/05/17. Negative vaginitis and cervicitis testing.  UC neg.  Had endometriosis in the past.  Had hysterectomy.   No HTN. No migraines with aura.  No liver disease.  No hx DVT/PE.   Took combine OCPs in the past without problems.   Review of Systems  LMP: 03/15/1998 Contraception: Hysterectomy -- one ovary remains     Objective:   Physical Exam  Genitourinary:     Consent for procedure  3% acetic acid used at vaginal cuff.  No acetowhite lesions.  Blue 1.3 cm lesion noted.   Endometrioma suspected.  Biopsy of mucosa performed and tissue to pathology.  Old blood drained.  No complications.  Minimal EBL.    Assessment:     Vaginal lesion consistent with endometrioma.    Plan:     Will send biopsy to pathology.  Nothing per vagina until bleeding stops. Start Seasonale.  one pack to pharmacy.  Warning signs given and discussed risk of stroke, DVT, PE, MI.  Follow up in 6 weeks for a recheck.   ____15___ minutes face to face time of which over 50% was spent in counseling.    After visit summary to patient.

## 2017-05-06 ENCOUNTER — Telehealth: Payer: Self-pay | Admitting: Obstetrics and Gynecology

## 2017-05-06 MED ORDER — NORETHINDRONE ACET-ETHINYL EST 1.5-30 MG-MCG PO TABS: 1.0000 | ORAL_TABLET | Freq: Every day | ORAL | 0 refills | Status: DC

## 2017-05-06 NOTE — Telephone Encounter (Signed)
Spoke with patient. Patient states that she started Seasonale 2 weeks ago and 3 days after starting the pill she began having increased bowel movements, gas, and bloating. Reports she was not having any of these symptoms prior to starting this OCP. Wants an alternative due to symptoms. Advised will review with Dr.Silva and return call.

## 2017-05-06 NOTE — Telephone Encounter (Signed)
Stop Seasonale.  Ok for LoEstrin 1.5/30 continuous OCPs for 3 months.  Skip any placebo pills.  Dispense 4 packs, RF none.  She needs a follow up appointment with be before these pills run out.

## 2017-05-06 NOTE — Telephone Encounter (Signed)
Spoke with patient. Advised of message as seen below from Dr.Silva. Patient verbalizes understanding. Rx for LoEstrin 1.5/30 continuous OCPs for 3 months.  Dispense 4 packs, RF none sent to pharmacy on file. Patient is agreeable. Has an appointment in April with Dr.Silva and will discuss medication at that time and schedule another appointment around 3 months if needed. Encounter closed.

## 2017-05-06 NOTE — Telephone Encounter (Signed)
Patient called requesting to speak with the nurse about changing birth control.

## 2017-06-04 ENCOUNTER — Ambulatory Visit: Payer: BLUE CROSS/BLUE SHIELD | Admitting: Obstetrics and Gynecology

## 2017-07-14 ENCOUNTER — Other Ambulatory Visit: Payer: Self-pay | Admitting: Obstetrics and Gynecology

## 2017-07-15 NOTE — Telephone Encounter (Signed)
Medication refill request: OCP  Last AEX:  11-07-16 Next AEX: 12-17-17  Last MMG (if hormonal medication request): 12-27-16 WNL  Refill authorized: please advise

## 2017-07-23 ENCOUNTER — Other Ambulatory Visit: Payer: Self-pay | Admitting: Obstetrics and Gynecology

## 2017-07-23 NOTE — Telephone Encounter (Signed)
Medication refill request: OCP Last AEX:  11/07/16 DL Next AEX: 16/02/909/5/19 DL Last MMG (if hormonal medication request): 12/27/16 BIRADS1:Neg  Refill authorized: 05/06/17 #4/0R. Today #4packs/1R?

## 2017-07-23 NOTE — Telephone Encounter (Signed)
This patient has had hysterectomy and I did not fill an Rx for last year

## 2017-07-29 DIAGNOSIS — R350 Frequency of micturition: Secondary | ICD-10-CM | POA: Diagnosis not present

## 2017-07-29 DIAGNOSIS — N3000 Acute cystitis without hematuria: Secondary | ICD-10-CM | POA: Diagnosis not present

## 2017-10-21 ENCOUNTER — Telehealth: Payer: Self-pay | Admitting: Obstetrics and Gynecology

## 2017-10-21 NOTE — Telephone Encounter (Signed)
Spoke with patient. Patient requesting OCP for abdominal & pelvic pain "caused by endometriosis". Pain 4/10. 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   Hx of hysterectomy. Denies vaginal d/c, odor, bleeding, N/V, fever/chills, or urinary symptoms.   Patient declined OV, states she has been seen for the same pain in the past, requesting new OCP. Advised I will review with Dr. Edward Jolly and return call with recommendations, patient agreeable.   Dr. Edward Jolly -please advise.

## 2017-10-21 NOTE — Telephone Encounter (Signed)
I recommend an office visit.  She could come in first thing tomorrow morning.  There are some very different options we have for treatment of her symptoms.

## 2017-10-21 NOTE — Telephone Encounter (Signed)
Patient requesting a new OCP.Marland Kitchen Patient stated that she has not been on anything since being taken off of two previous OCP. Patient stated that the endometriosis is causing a lot of pelvic pain. Patient declined office visit for today.

## 2017-10-21 NOTE — Telephone Encounter (Signed)
Spoke with patient, advised as seen below per Dr. Edward Jolly. Patient states she has tried alternatives in the past such as Lupron and is not interested in hearing alternative options. Patient states " if OCP can't be prescribed, I will just wait until perimenopause passes and see what happens".    Patient declined OV at this time. Advised to return call if she has any additional questions or to schedule OV.   Routing to provider for final review. Patient is agreeable to disposition. Will close encounter.

## 2017-12-12 DIAGNOSIS — F331 Major depressive disorder, recurrent, moderate: Secondary | ICD-10-CM | POA: Diagnosis not present

## 2017-12-12 DIAGNOSIS — I10 Essential (primary) hypertension: Secondary | ICD-10-CM | POA: Diagnosis not present

## 2017-12-12 DIAGNOSIS — M545 Low back pain: Secondary | ICD-10-CM | POA: Diagnosis not present

## 2017-12-12 DIAGNOSIS — E119 Type 2 diabetes mellitus without complications: Secondary | ICD-10-CM | POA: Diagnosis not present

## 2017-12-17 ENCOUNTER — Ambulatory Visit: Payer: BLUE CROSS/BLUE SHIELD | Admitting: Certified Nurse Midwife

## 2017-12-17 ENCOUNTER — Other Ambulatory Visit: Payer: Self-pay

## 2017-12-17 ENCOUNTER — Encounter: Payer: Self-pay | Admitting: Certified Nurse Midwife

## 2017-12-17 VITALS — BP 120/80 | HR 70 | Resp 16 | Wt 199.0 lb

## 2017-12-17 DIAGNOSIS — Z8679 Personal history of other diseases of the circulatory system: Secondary | ICD-10-CM | POA: Diagnosis not present

## 2017-12-17 DIAGNOSIS — Z01419 Encounter for gynecological examination (general) (routine) without abnormal findings: Secondary | ICD-10-CM

## 2017-12-17 DIAGNOSIS — Z0000 Encounter for general adult medical examination without abnormal findings: Secondary | ICD-10-CM | POA: Diagnosis not present

## 2017-12-17 LAB — POCT URINALYSIS DIPSTICK
BILIRUBIN UA: NEGATIVE
Blood, UA: NEGATIVE
Glucose, UA: NEGATIVE
KETONES UA: NEGATIVE
Leukocytes, UA: NEGATIVE
NITRITE UA: NEGATIVE
PH UA: 5 (ref 5.0–8.0)
PROTEIN UA: NEGATIVE
UROBILINOGEN UA: NEGATIVE U/dL — AB

## 2017-12-17 NOTE — Progress Notes (Signed)
Reviewed Cologuard process with patient while in office, patient request to proceed. Order faxed to [facility]. Patient verbalizes understanding and is agreeable.

## 2017-12-17 NOTE — Progress Notes (Signed)
52 y.o. G54P2002 Married  Caucasian Fe here for annual exam. Menopausal occasional hot flash, denies vaginal dryness. Sees PCP Dr. Tiburcio Pea for hypertension, insomnia, labs and aex. New diagnosis of hypertension and now working on weight change now. Feeling better on medication. No other health issues today.    Sexually active: Yes.    The current method of family planning is status post hysterectomy.    Exercising: No.  exercise Smoker:  no  Review of Systems  Constitutional: Negative.   HENT: Negative.   Eyes: Negative.   Respiratory: Negative.   Cardiovascular: Negative.   Gastrointestinal: Negative.   Genitourinary: Negative.   Musculoskeletal: Negative.   Skin: Negative.   Neurological: Negative.   Endo/Heme/Allergies: Negative.   Psychiatric/Behavioral: Negative.     Health Maintenance: Pap:  11-07-15 neg HPV HR neg, biopsy for endometriosis 3/19 History of Abnormal Pap: no MMG:  12-27-16 category b density birads 1:neg Self Breast exams: yes Colonoscopy:  none BMD:   none TDaP:  2019 Shingles: no Pneumonia: no Hep C and HIV: not done Labs: none   reports that she has never smoked. She has never used smokeless tobacco. She reports that she does not drink alcohol or use drugs.  Past Medical History:  Diagnosis Date  . Depression   . Gastritis    related to NSAID use    Past Surgical History:  Procedure Laterality Date  . ABDOMINAL HYSTERECTOMY  03/1998   LSO,RT. Cystectomy  . AUGMENTATION MAMMAPLASTY Bilateral 1996   breast lift  . CESAREAN SECTION  1990, 1993   x 2  . TONSILLECTOMY      Current Outpatient Medications  Medication Sig Dispense Refill  . BIOTIN PO Take by mouth.    . cyclobenzaprine (FLEXERIL) 10 MG tablet Take 10 mg by mouth 3 (three) times daily as needed for muscle spasms. Neck and shoulder pain    . docusate sodium (COLACE) 250 MG capsule Take 1 capsule (250 mg total) by mouth daily. 14 capsule 0  . fexofenadine (ALLEGRA) 180 MG tablet  Take 1 tablet (180 mg total) by mouth 2 (two) times daily as needed for allergies or rhinitis. 60 tablet 3  . hydrOXYzine (ATARAX/VISTARIL) 25 MG tablet Take 25 mg by mouth every 6 (six) hours as needed for anxiety or itching.   2  . lidocaine (XYLOCAINE) 2 % jelly Place 1 application into the urethra 3 (three) times daily as needed. 30 mL 0  . losartan (COZAAR) 25 MG tablet TAKE 1 TABLET BY MOUTH EVERY DAY FOR BLOOD PRESSURE  6  . ranitidine (ZANTAC) 150 MG tablet Take 1 tablet (150 mg total) by mouth 2 (two) times daily. 60 tablet 3  . temazepam (RESTORIL) 30 MG capsule Take 30 mg by mouth at bedtime.     . traMADol (ULTRAM) 50 MG tablet Take 50 mg by mouth at bedtime.     Marland Kitchen UNABLE TO FIND viviscal    . nabumetone (RELAFEN) 500 MG tablet Take 500 mg by mouth 2 (two) times daily as needed for moderate pain. Ulcer     No current facility-administered medications for this visit.     Family History  Problem Relation Age of Onset  . Hypertension Mother   . Diabetes Mother   . Osteoporosis Mother   . Cancer Maternal Grandfather   . Breast cancer Maternal Aunt        50ish and 70ish  . Allergic rhinitis Neg Hx   . Angioedema Neg Hx   . Asthma  Neg Hx   . Eczema Neg Hx     ROS:  Pertinent items are noted in HPI.  Otherwise, a comprehensive ROS was negative.  Exam:   BP 120/80   Pulse 70   Resp 16   Wt 199 lb (90.3 kg)   LMP 03/15/1998 (Approximate)   BMI 34.97 kg/m    Ht Readings from Last 3 Encounters:  11/07/16 5' 3.25" (1.607 m)  08/02/16 5' 3.5" (1.613 m)  11/07/15 5' 3.5" (1.613 m)    General appearance: alert, cooperative and appears stated age Head: Normocephalic, without obvious abnormality, atraumatic Neck: no adenopathy, supple, symmetrical, trachea midline and thyroid normal to inspection and palpation Lungs: clear to auscultation bilaterally Breasts: normal appearance, no masses or tenderness, No nipple retraction or dimpling, No nipple discharge or bleeding, No  axillary or supraclavicular adenopathy Heart: regular rate and rhythm Abdomen: soft, non-tender; no masses,  no organomegaly Extremities: extremities normal, atraumatic, no cyanosis or edema Skin: Skin color, texture, turgor normal. No rashes or lesions Lymph nodes: Cervical, supraclavicular, and axillary nodes normal. No abnormal inguinal nodes palpated Neurologic: Grossly normal   Pelvic: External genitalia:  no lesions, normal female              Urethra:  normal appearing urethra with no masses, tenderness or lesions              Bartholin's and Skene's: normal                 Vagina: normal appearing vagina with normal color and discharge, no lesions              Cervix: absent              Pap taken: No. Bimanual Exam:  Uterus:  uterus absent              Adnexa: no mass, fullness, tenderness               Rectovaginal: Confirms               Anus:  normal sphincter tone, no lesions  Chaperone present: yes  A:  Well Woman with normal exam  Menopausal no HRT  New diagnosis with PCP of hypertension on medication  Colonoscopy due  P:   Reviewed health and wellness pertinent to exam  Aware of need to advise if vaginal dryness or bleeding.  Discussed need to continue to work on weight loss and dietary change to help with hypertension control. Continue follow up with PCP as indicated. Warning signs with hypertension given.  Risks/benefits of colonoscopy and cologard given. Patient is not ready to schedule colonoscopy and would like to do Cologard screening. Information given, questions addressed. Patient requested Cologard ordered. Patient will be contacted by the company and it will be shipped to her. Questions addressed.  Pap smear: no   counseled on breast self exam, mammography screening, feminine hygiene, adequate intake of calcium and vitamin D, diet and exercise  return annually or prn  An After Visit Summary was printed and given to the patient.

## 2017-12-17 NOTE — Patient Instructions (Signed)

## 2017-12-29 DIAGNOSIS — Z1211 Encounter for screening for malignant neoplasm of colon: Secondary | ICD-10-CM | POA: Diagnosis not present

## 2017-12-29 DIAGNOSIS — Z1212 Encounter for screening for malignant neoplasm of rectum: Secondary | ICD-10-CM | POA: Diagnosis not present

## 2017-12-30 DIAGNOSIS — I10 Essential (primary) hypertension: Secondary | ICD-10-CM | POA: Diagnosis not present

## 2018-01-08 ENCOUNTER — Telehealth: Payer: Self-pay | Admitting: *Deleted

## 2018-01-08 ENCOUNTER — Other Ambulatory Visit: Payer: Self-pay | Admitting: *Deleted

## 2018-01-08 DIAGNOSIS — Z1212 Encounter for screening for malignant neoplasm of rectum: Secondary | ICD-10-CM

## 2018-01-08 DIAGNOSIS — Z1211 Encounter for screening for malignant neoplasm of colon: Secondary | ICD-10-CM

## 2018-01-08 LAB — COLOGUARD: Cologuard: NEGATIVE

## 2018-01-08 NOTE — Telephone Encounter (Signed)
Detailed message left per DPR on home number advising patient of negative cologuard results. Advised patient to return call if any additional questions.   Routing to provider and will close encounter.

## 2018-01-16 DIAGNOSIS — H5319 Other subjective visual disturbances: Secondary | ICD-10-CM | POA: Diagnosis not present

## 2018-01-29 ENCOUNTER — Encounter: Payer: Self-pay | Admitting: Certified Nurse Midwife

## 2018-03-05 DIAGNOSIS — R399 Unspecified symptoms and signs involving the genitourinary system: Secondary | ICD-10-CM | POA: Diagnosis not present

## 2018-03-05 DIAGNOSIS — I10 Essential (primary) hypertension: Secondary | ICD-10-CM | POA: Diagnosis not present

## 2018-05-07 DIAGNOSIS — J0100 Acute maxillary sinusitis, unspecified: Secondary | ICD-10-CM | POA: Diagnosis not present

## 2018-07-28 DIAGNOSIS — Z0000 Encounter for general adult medical examination without abnormal findings: Secondary | ICD-10-CM | POA: Diagnosis not present

## 2018-07-28 DIAGNOSIS — E119 Type 2 diabetes mellitus without complications: Secondary | ICD-10-CM | POA: Diagnosis not present

## 2018-07-28 DIAGNOSIS — M545 Low back pain: Secondary | ICD-10-CM | POA: Diagnosis not present

## 2018-07-28 DIAGNOSIS — E7800 Pure hypercholesterolemia, unspecified: Secondary | ICD-10-CM | POA: Diagnosis not present

## 2018-07-28 DIAGNOSIS — I10 Essential (primary) hypertension: Secondary | ICD-10-CM | POA: Diagnosis not present

## 2018-09-02 DIAGNOSIS — I10 Essential (primary) hypertension: Secondary | ICD-10-CM | POA: Diagnosis not present

## 2018-09-02 DIAGNOSIS — E7800 Pure hypercholesterolemia, unspecified: Secondary | ICD-10-CM | POA: Diagnosis not present

## 2018-09-02 DIAGNOSIS — E119 Type 2 diabetes mellitus without complications: Secondary | ICD-10-CM | POA: Diagnosis not present

## 2018-09-25 ENCOUNTER — Other Ambulatory Visit: Payer: Self-pay | Admitting: Certified Nurse Midwife

## 2018-09-25 DIAGNOSIS — Z1231 Encounter for screening mammogram for malignant neoplasm of breast: Secondary | ICD-10-CM

## 2018-10-14 DIAGNOSIS — M533 Sacrococcygeal disorders, not elsewhere classified: Secondary | ICD-10-CM | POA: Diagnosis not present

## 2018-10-15 ENCOUNTER — Other Ambulatory Visit: Payer: Self-pay

## 2018-10-15 ENCOUNTER — Ambulatory Visit
Admission: RE | Admit: 2018-10-15 | Discharge: 2018-10-15 | Disposition: A | Discharge status: Home / Self Care | Payer: BC Managed Care – PPO | Source: Ambulatory Visit | Attending: Physician Assistant | Admitting: Physician Assistant

## 2018-10-15 ENCOUNTER — Other Ambulatory Visit: Payer: Self-pay | Admitting: Physician Assistant

## 2018-10-15 DIAGNOSIS — M533 Sacrococcygeal disorders, not elsewhere classified: Secondary | ICD-10-CM

## 2018-10-15 DIAGNOSIS — M545 Low back pain: Secondary | ICD-10-CM | POA: Diagnosis not present

## 2018-11-07 ENCOUNTER — Ambulatory Visit
Admission: RE | Admit: 2018-11-07 | Discharge: 2018-11-07 | Disposition: A | Discharge status: Home / Self Care | Payer: BC Managed Care – PPO | Source: Ambulatory Visit | Attending: Certified Nurse Midwife | Admitting: Certified Nurse Midwife

## 2018-11-07 ENCOUNTER — Other Ambulatory Visit: Payer: Self-pay

## 2018-11-07 DIAGNOSIS — Z1231 Encounter for screening mammogram for malignant neoplasm of breast: Secondary | ICD-10-CM | POA: Diagnosis not present

## 2018-12-15 DIAGNOSIS — I10 Essential (primary) hypertension: Secondary | ICD-10-CM | POA: Diagnosis not present

## 2018-12-15 DIAGNOSIS — E7800 Pure hypercholesterolemia, unspecified: Secondary | ICD-10-CM | POA: Diagnosis not present

## 2018-12-15 DIAGNOSIS — M545 Low back pain: Secondary | ICD-10-CM | POA: Diagnosis not present

## 2018-12-15 DIAGNOSIS — E119 Type 2 diabetes mellitus without complications: Secondary | ICD-10-CM | POA: Diagnosis not present

## 2018-12-23 ENCOUNTER — Encounter: Payer: Self-pay | Admitting: Certified Nurse Midwife

## 2018-12-23 ENCOUNTER — Other Ambulatory Visit: Payer: Self-pay

## 2018-12-23 ENCOUNTER — Ambulatory Visit (INDEPENDENT_AMBULATORY_CARE_PROVIDER_SITE_OTHER): Payer: BC Managed Care – PPO | Admitting: Certified Nurse Midwife

## 2018-12-23 VITALS — BP 120/80 | HR 70 | Temp 97.2°F | Resp 16 | Ht 63.5 in | Wt 202.0 lb

## 2018-12-23 DIAGNOSIS — N951 Menopausal and female climacteric states: Secondary | ICD-10-CM | POA: Diagnosis not present

## 2018-12-23 DIAGNOSIS — Z8742 Personal history of other diseases of the female genital tract: Secondary | ICD-10-CM

## 2018-12-23 DIAGNOSIS — Z01419 Encounter for gynecological examination (general) (routine) without abnormal findings: Secondary | ICD-10-CM

## 2018-12-23 NOTE — Patient Instructions (Signed)

## 2018-12-23 NOTE — Progress Notes (Signed)
53 y.o. G14P2002 Married  Caucasian Fe here for annual exam. Menopausal no vaginal bleeding or vaginal dryness. Continues with hot flashes and night sweats occasional, but doing well. Had small hemorrhagic cyst removal with endometriosis appearance, in 04/2018 with no issues now. Patient has been noting "tailbone issues". She is keeping her grandchildren with increase in lifting. Area was X-rayed and has appointment with Orthopedic. Sees PCP for medication management of hypertension. All stable. No other health issues today.  Patient's last menstrual period was 03/15/1998 (approximate).          Sexually active: Yes.    The current method of family planning is status post hysterectomy.    Exercising: No.  exercise Smoker:  no  Review of Systems  Constitutional: Negative.   HENT: Negative.   Eyes: Negative.   Respiratory: Negative.   Cardiovascular: Negative.   Gastrointestinal: Negative.   Genitourinary: Negative.   Musculoskeletal: Negative.   Skin: Negative.   Neurological: Negative.   Endo/Heme/Allergies: Negative.   Psychiatric/Behavioral: Negative.     Health Maintenance: Pap:  11-07-15 neg HPV HR neg, endo bx 3/19 History of Abnormal Pap: no MMG:  11-07-2018 category b density birads 1:neg Self Breast exams: occ Colonoscopy:  none BMD:   none TDaP:  2019 Shingles: no Pneumonia: no Hep C and HIV: not done Labs: if needed   reports that she has never smoked. She has never used smokeless tobacco. She reports that she does not drink alcohol or use drugs.  Past Medical History:  Diagnosis Date  . Depression   . Endometriosis   . Gastritis    related to NSAID use    Past Surgical History:  Procedure Laterality Date  . ABDOMINAL HYSTERECTOMY  03/1998   LSO,RT. Cystectomy  . [place]   x 2  . REDUCTION MAMMAPLASTY     breast lift  . TONSILLECTOMY      Current Outpatient Medications  Medication Sig Dispense Refill  . BIOTIN PO Take by mouth.     . cyclobenzaprine (FLEXERIL) 10 MG tablet Take 10 mg by mouth 3 (three) times daily as needed for muscle spasms. Neck and shoulder pain    . docusate sodium (COLACE) 250 MG capsule Take 1 capsule (250 mg total) by mouth daily. 14 capsule 0  . fexofenadine (ALLEGRA) 180 MG tablet Take 1 tablet (180 mg total) by mouth 2 (two) times daily as needed for allergies or rhinitis. 60 tablet 3  . hydrOXYzine (ATARAX/VISTARIL) 25 MG tablet Take 25 mg by mouth every 6 (six) hours as needed for anxiety or itching.   2  . losartan (COZAAR) 100 MG tablet Take 100 mg by mouth daily.    . predniSONE (DELTASONE) 10 MG tablet TAPER TAKE DAILY AS DIRECTED  4/4/4/3/3/3/2/2/2/1/1/1 ONCE A DAY ORALLY 12 DAYS    . temazepam (RESTORIL) 30 MG capsule Take 30 mg by mouth at bedtime.     . traMADol (ULTRAM) 50 MG tablet Take 50 mg by mouth at bedtime.     Marland Kitchen UNABLE TO FIND viviscal    . celecoxib (CELEBREX) 200 MG capsule Take 200 mg by mouth daily.     No current facility-administered medications for this visit.     Family History  Problem Relation Age of Onset  . Hypertension Mother   . Diabetes Mother   . Osteoporosis Mother   . Cancer Maternal Grandfather   . Breast cancer Maternal Aunt        50ish and 70ish  .  Allergic rhinitis Neg Hx   . Angioedema Neg Hx   . Asthma Neg Hx   . Eczema Neg Hx     ROS:  Pertinent items are noted in HPI.  Otherwise, a comprehensive ROS was negative.  Exam:   BP 120/80   Pulse 70   Temp (!) 97.2 F (36.2 C) (Skin)   Resp 16   Ht 5' 3.5" (1.613 m)   Wt 202 lb (91.6 kg)   LMP 03/15/1998 (Approximate)   BMI 35.22 kg/m  Height: 5' 3.5" (161.3 cm) Ht Readings from Last 3 Encounters:  12/23/18 5' 3.5" (1.613 m)  11/07/16 5' 3.25" (1.607 m)  08/02/16 5' 3.5" (1.613 m)    General appearance: alert, cooperative and appears stated age Head: Normocephalic, without obvious abnormality, atraumatic Neck: no adenopathy, supple, symmetrical, trachea midline and thyroid  normal to inspection and palpation Lungs: clear to auscultation bilaterally Breasts: normal appearance, no masses or tenderness, No nipple retraction or dimpling, No nipple discharge or bleeding, No axillary or supraclavicular adenopathy Heart: regular rate and rhythm Abdomen: soft, non-tender; no masses,  no organomegaly Extremities: extremities normal, atraumatic, no cyanosis or edema Skin: Skin color, texture, turgor normal. No rashes or lesions Lymph nodes: Cervical, supraclavicular, and axillary nodes normal. No abnormal inguinal nodes palpated Neurologic: Grossly normal   Pelvic: External genitalia:  no lesions              Urethra:  normal appearing urethra with no masses, tenderness or lesions              Bartholin's and Skene's: normal                 Vagina: normal appearing vagina with normal color and discharge, no lesions              Cervix: absent              Pap taken: No. Bimanual Exam:  Uterus:  uterus absent              Adnexa: normal adnexa, no mass, fullness, tenderness and left surgically absent               Rectovaginal: Confirms, palpated coccyx vaginally with no concerns noted and no increase in pain               Anus:  normal sphincter tone, no lesions  Chaperone present: yes  A:  Well Woman with normal exam  Menopausal s/p TAH with LSO for endometriosis  Hypertension management with PCP  Coccyx pain with appointment with orthopedic soon    P:   Reviewed health and wellness pertinent to exam  Aware to advise if vaginal bleeding or vaginal dryness issues  Continue follow up with PCP as indicated.  Keep appointment as scheduled  Pap smear: no   counseled on breast self exam, mammography screening, feminine hygiene, menopause, adequate intake of calcium and vitamin D, diet and exercise  return annually or prn  An After Visit Summary was printed and given to the patient.

## 2018-12-30 DIAGNOSIS — M25552 Pain in left hip: Secondary | ICD-10-CM | POA: Diagnosis not present

## 2019-01-26 DIAGNOSIS — I10 Essential (primary) hypertension: Secondary | ICD-10-CM | POA: Diagnosis not present

## 2019-01-26 DIAGNOSIS — E119 Type 2 diabetes mellitus without complications: Secondary | ICD-10-CM | POA: Diagnosis not present

## 2019-01-26 DIAGNOSIS — M545 Low back pain: Secondary | ICD-10-CM | POA: Diagnosis not present

## 2019-01-26 DIAGNOSIS — E7800 Pure hypercholesterolemia, unspecified: Secondary | ICD-10-CM | POA: Diagnosis not present

## 2019-05-04 ENCOUNTER — Encounter: Payer: Self-pay | Admitting: Certified Nurse Midwife

## 2019-05-08 ENCOUNTER — Ambulatory Visit: Payer: BC Managed Care – PPO | Attending: Internal Medicine

## 2019-05-08 DIAGNOSIS — Z23 Encounter for immunization: Secondary | ICD-10-CM

## 2019-05-08 NOTE — Progress Notes (Signed)
   Covid-19 Vaccination Clinic  Name:  Erica Dennis    MRN: [id number] DOB: Oct 28, 1965  05/08/2019  Erica Dennis was observed post Covid-19 immunization for 15 minutes without incident. She was provided with Vaccine Information Sheet and instruction to access the V-Safe system.   Erica Dennis was instructed to call 911 with any severe reactions post vaccine: Marland Kitchen Difficulty breathing  . Swelling of face and throat  . A fast heartbeat  . A bad rash all over body  . Dizziness and weakness   Immunizations Administered    Name Date Dose VIS Date Route   Pfizer COVID-19 Vaccine 05/08/2019  3:04 PM 0.3 mL 01/23/2019 Intramuscular   Manufacturer: [employer], [employer]   Lot: [id number]   NDC: [address]-9

## 2019-06-03 ENCOUNTER — Ambulatory Visit: Payer: BC Managed Care – PPO | Attending: Internal Medicine

## 2019-06-03 DIAGNOSIS — Z23 Encounter for immunization: Secondary | ICD-10-CM

## 2019-06-03 NOTE — Progress Notes (Signed)
   Covid-19 Vaccination Clinic  Name:  SCHWANDA ZIMA    MRN: [id number] DOB: 05-06-1965  06/03/2019  Ms. Leifheit was observed post Covid-19 immunization for 15 minutes without incident. She was provided with Vaccine Information Sheet and instruction to access the V-Safe system.   Ms. Jolin was instructed to call 911 with any severe reactions post vaccine: Marland Kitchen Difficulty breathing  . Swelling of face and throat  . A fast heartbeat  . A bad rash all over body  . Dizziness and weakness   Immunizations Administered    Name Date Dose VIS Date Route   Pfizer COVID-19 Vaccine 06/03/2019  8:27 AM 0.3 mL 04/08/2018 Intramuscular   Manufacturer: [employer], [employer]   Lot: [id number]   NDC: [address]-0

## 2019-08-03 DIAGNOSIS — E119 Type 2 diabetes mellitus without complications: Secondary | ICD-10-CM | POA: Diagnosis not present

## 2019-08-03 DIAGNOSIS — I10 Essential (primary) hypertension: Secondary | ICD-10-CM | POA: Diagnosis not present

## 2019-08-03 DIAGNOSIS — G479 Sleep disorder, unspecified: Secondary | ICD-10-CM | POA: Diagnosis not present

## 2019-08-03 DIAGNOSIS — E7800 Pure hypercholesterolemia, unspecified: Secondary | ICD-10-CM | POA: Diagnosis not present

## 2019-10-21 DIAGNOSIS — H52223 Regular astigmatism, bilateral: Secondary | ICD-10-CM | POA: Diagnosis not present

## 2019-10-21 DIAGNOSIS — D3131 Benign neoplasm of right choroid: Secondary | ICD-10-CM | POA: Diagnosis not present

## 2019-10-21 DIAGNOSIS — R7303 Prediabetes: Secondary | ICD-10-CM | POA: Diagnosis not present

## 2019-11-26 DIAGNOSIS — L739 Follicular disorder, unspecified: Secondary | ICD-10-CM | POA: Diagnosis not present

## 2019-12-28 ENCOUNTER — Ambulatory Visit: Payer: BC Managed Care – PPO | Admitting: Certified Nurse Midwife

## 2020-02-09 DIAGNOSIS — E7800 Pure hypercholesterolemia, unspecified: Secondary | ICD-10-CM | POA: Diagnosis not present

## 2020-02-09 DIAGNOSIS — E119 Type 2 diabetes mellitus without complications: Secondary | ICD-10-CM | POA: Diagnosis not present

## 2020-02-09 DIAGNOSIS — I10 Essential (primary) hypertension: Secondary | ICD-10-CM | POA: Diagnosis not present

## 2020-02-09 DIAGNOSIS — G479 Sleep disorder, unspecified: Secondary | ICD-10-CM | POA: Diagnosis not present

## 2020-02-18 ENCOUNTER — Ambulatory Visit: Payer: BC Managed Care – PPO | Admitting: Obstetrics and Gynecology

## 2020-02-18 NOTE — Progress Notes (Deleted)
55 y.o. G30P2002 Married Caucasian female here for annual exam.    PCP:     Patient's last menstrual period was 03/15/1998 (approximate).           Sexually active: {yes no:314532}  The current method of family planning is status post hysterectomy.    Exercising: {yes no:314532}  {types:19826} Smoker:  no  Health Maintenance: Pap: 11-07-15 Neg:Neg HR HPV History of abnormal Pap:  no MMG: ***11-07-18 3D/Neg/density B/BiRaads1 Colonoscopy: *** BMD:   n/a  Result  n/a TDaP:  2019 Gardasil:   no HIV: Neg in preg Hep C:no Screening Labs:  Hb today: ***, Urine today: ***   reports that she has never smoked. She has never used smokeless tobacco. She reports that she does not drink alcohol and does not use drugs.  Past Medical History:  Diagnosis Date  . Depression   . Endometriosis   . Gastritis    related to NSAID use    Past Surgical History:  Procedure Laterality Date  . ABDOMINAL HYSTERECTOMY  03/1998   LSO,RT. Cystectomy  . CESAREAN SECTION  1990, 1993   x 2  . REDUCTION MAMMAPLASTY     breast lift  . TONSILLECTOMY      Current Outpatient Medications  Medication Sig Dispense Refill  . BIOTIN PO Take by mouth.    . celecoxib (CELEBREX) 200 MG capsule Take 200 mg by mouth daily.    . cyclobenzaprine (FLEXERIL) 10 MG tablet Take 10 mg by mouth 3 (three) times daily as needed for muscle spasms. Neck and shoulder pain    . docusate sodium (COLACE) 250 MG capsule Take 1 capsule (250 mg total) by mouth daily. 14 capsule 0  . fexofenadine (ALLEGRA) 180 MG tablet Take 1 tablet (180 mg total) by mouth 2 (two) times daily as needed for allergies or rhinitis. 60 tablet 3  . hydrOXYzine (ATARAX/VISTARIL) 25 MG tablet Take 25 mg by mouth every 6 (six) hours as needed for anxiety or itching.   2  . losartan (COZAAR) 100 MG tablet Take 100 mg by mouth daily.    . predniSONE (DELTASONE) 10 MG tablet TAPER TAKE DAILY AS DIRECTED  4/4/4/3/3/3/2/2/2/1/1/1 ONCE A DAY ORALLY 12 DAYS    .  temazepam (RESTORIL) 30 MG capsule Take 30 mg by mouth at bedtime.     . traMADol (ULTRAM) 50 MG tablet Take 50 mg by mouth at bedtime.     Marland Kitchen UNABLE TO FIND viviscal     No current facility-administered medications for this visit.    Family History  Problem Relation Age of Onset  . Hypertension Mother   . Diabetes Mother   . Osteoporosis Mother   . Cancer Maternal Grandfather   . Breast cancer Maternal Aunt        50ish and 70ish  . Allergic rhinitis Neg Hx   . Angioedema Neg Hx   . Asthma Neg Hx   . Eczema Neg Hx     Review of Systems  Exam:   LMP 03/15/1998 (Approximate)     General appearance: alert, cooperative and appears stated age Head: normocephalic, without obvious abnormality, atraumatic Neck: no adenopathy, supple, symmetrical, trachea midline and thyroid normal to inspection and palpation Lungs: clear to auscultation bilaterally Breasts: normal appearance, no masses or tenderness, No nipple retraction or dimpling, No nipple discharge or bleeding, No axillary adenopathy Heart: regular rate and rhythm Abdomen: soft, non-tender; no masses, no organomegaly Extremities: extremities normal, atraumatic, no cyanosis or edema Skin: skin color, texture,  turgor normal. No rashes or lesions Lymph nodes: cervical, supraclavicular, and axillary nodes normal. Neurologic: grossly normal  Pelvic: External genitalia:  no lesions              No abnormal inguinal nodes palpated.              Urethra:  normal appearing urethra with no masses, tenderness or lesions              Bartholins and Skenes: normal                 Vagina: normal appearing vagina with normal color and discharge, no lesions              Cervix: no lesions              Pap taken: {yes no:314532} Bimanual Exam:  Uterus:  normal size, contour, position, consistency, mobility, non-tender              Adnexa: no mass, fullness, tenderness              Rectal exam: {yes no:314532}.  Confirms.              Anus:   normal sphincter tone, no lesions  Chaperone was present for exam.  Assessment:   Well woman visit with normal exam.   Plan: Mammogram screening discussed. Self breast awareness reviewed. Pap and HR HPV as above. Guidelines for Calcium, Vitamin D, regular exercise program including cardiovascular and weight bearing exercise.   Follow up annually and prn.   Additional counseling given.  {yes [id number]. _______ minutes face to face time of which over 50% was spent in counseling.    After visit summary provided.

## 2020-03-09 DIAGNOSIS — R202 Paresthesia of skin: Secondary | ICD-10-CM | POA: Diagnosis not present

## 2020-03-09 DIAGNOSIS — R531 Weakness: Secondary | ICD-10-CM | POA: Diagnosis not present

## 2020-03-09 DIAGNOSIS — H547 Unspecified visual loss: Secondary | ICD-10-CM | POA: Diagnosis not present

## 2020-03-11 ENCOUNTER — Other Ambulatory Visit: Payer: Self-pay | Admitting: Family Medicine

## 2020-03-11 DIAGNOSIS — R531 Weakness: Secondary | ICD-10-CM

## 2020-03-24 DIAGNOSIS — H60501 Unspecified acute noninfective otitis externa, right ear: Secondary | ICD-10-CM | POA: Diagnosis not present

## 2020-03-30 ENCOUNTER — Other Ambulatory Visit: Payer: Self-pay | Admitting: Family Medicine

## 2020-03-30 ENCOUNTER — Ambulatory Visit
Admission: RE | Admit: 2020-03-30 | Discharge: 2020-03-30 | Disposition: A | Discharge status: Home / Self Care | Payer: BC Managed Care – PPO | Source: Ambulatory Visit | Attending: Family Medicine | Admitting: Family Medicine

## 2020-03-30 ENCOUNTER — Other Ambulatory Visit: Payer: Self-pay

## 2020-03-30 DIAGNOSIS — R531 Weakness: Secondary | ICD-10-CM

## 2020-03-30 DIAGNOSIS — I6522 Occlusion and stenosis of left carotid artery: Secondary | ICD-10-CM | POA: Diagnosis not present

## 2020-03-30 DIAGNOSIS — H538 Other visual disturbances: Secondary | ICD-10-CM | POA: Diagnosis not present

## 2020-03-30 DIAGNOSIS — I6529 Occlusion and stenosis of unspecified carotid artery: Secondary | ICD-10-CM

## 2020-03-30 HISTORY — DX: Occlusion and stenosis of unspecified carotid artery: I65.29

## 2020-06-06 ENCOUNTER — Ambulatory Visit: Payer: BC Managed Care – PPO | Admitting: Neurology

## 2020-07-13 DIAGNOSIS — E785 Hyperlipidemia, unspecified: Secondary | ICD-10-CM

## 2020-07-13 HISTORY — DX: Hyperlipidemia, unspecified: E78.5

## 2020-08-01 DIAGNOSIS — E119 Type 2 diabetes mellitus without complications: Secondary | ICD-10-CM | POA: Diagnosis not present

## 2020-08-01 DIAGNOSIS — F331 Major depressive disorder, recurrent, moderate: Secondary | ICD-10-CM | POA: Diagnosis not present

## 2020-08-01 DIAGNOSIS — I10 Essential (primary) hypertension: Secondary | ICD-10-CM | POA: Diagnosis not present

## 2020-08-01 DIAGNOSIS — E7800 Pure hypercholesterolemia, unspecified: Secondary | ICD-10-CM | POA: Diagnosis not present

## 2020-08-02 NOTE — Progress Notes (Signed)
56 y.o. G68P2002 Married Caucasian female here for annual exam.    Patient with hot flashes, irritability. Would like labs. States constant flashes and wakes up wet at night.   Has 2 grandchildren.   Oldest in 81 yo.  Cares for them 5 days a week.   82 yo dog died.  Patient felt really stressed and affected by this loss.   Had MRA showing stenosis of carotid artery. Evaluation was promtped by right arm weakness and blurred vision.   Vaccinated against Covid.   PCP: Johny Blamer, MD  Patient's last menstrual period was 03/15/1998 (approximate).           Sexually active: Yes.    The current method of family planning is status post hysterectomy.    Exercising: No.  The patient does not participate in regular exercise at present. Taking care of grandchildren Smoker:  no  Health Maintenance: Pap:  11-07-15 Neg:Neg HR HPV History of abnormal Pap:  no MMG: 11-07-18 3D/Neg/BiRads1 Colonoscopy: NEVER--had neg.cologuard, 12/2017.  BMD:   n/a  Result  n/a TDaP: 2019 Gardasil:   no HIV:no Hep C:no Screening Labs:  PCP   reports that she has never smoked. She has never used smokeless tobacco. She reports that she does not drink alcohol and does not use drugs.  Past Medical History:  Diagnosis Date   Carotid artery stenosis 03/30/2020   MRA IMAGING   Depression    Endometriosis    Gastritis    related to NSAID use   Hyperlipidemia 07/13/2020    Past Surgical History:  Procedure Laterality Date   ABDOMINAL HYSTERECTOMY  03/1998   LSO,RT. Cystectomy   CESAREAN SECTION  1990, 1993   x 2   REDUCTION MAMMAPLASTY     breast lift   TONSILLECTOMY      Current Outpatient Medications  Medication Sig Dispense Refill   PARoxetine (PAXIL) 10 MG tablet Take 1 tablet (10 mg total) by mouth every morning. 30 tablet 1   BIOTIN PO Take by mouth.     celecoxib (CELEBREX) 200 MG capsule Take 200 mg by mouth daily.     Cholecalciferol (VITAMIN D3) 125 MCG (5000 UT) CAPS See admin  instructions.     Cranberry-Vitamin C-Vitamin E (CRANBERRY PLUS VITAMIN C) 4200-20-3 MG-MG-UNIT CAPS See admin instructions.     cyclobenzaprine (FLEXERIL) 10 MG tablet Take 10 mg by mouth 3 (three) times daily as needed for muscle spasms. Neck and shoulder pain     Eszopiclone 3 MG TABS Take 3 mg by mouth at bedtime as needed.     hydrOXYzine (ATARAX/VISTARIL) 25 MG tablet Take 25 mg by mouth every 6 (six) hours as needed for anxiety or itching.   2   losartan (COZAAR) 100 MG tablet Take 100 mg by mouth daily.     rosuvastatin (CRESTOR) 10 MG tablet Take 10 mg by mouth at bedtime.     temazepam (RESTORIL) 30 MG capsule Take 30 mg by mouth at bedtime.      traMADol (ULTRAM) 50 MG tablet Take 50 mg by mouth at bedtime.      UNABLE TO FIND viviscal     No current facility-administered medications for this visit.    Family History  Problem Relation Age of Onset   Hypertension Mother    Diabetes Mother    Osteoporosis Mother    Cancer Maternal Grandfather    Breast cancer Maternal Aunt        [id number] and 70ish   Allergic rhinitis Neg  Hx    Angioedema Neg Hx    Asthma Neg Hx    Eczema Neg Hx     Review of Systems  Psychiatric/Behavioral:  Positive for agitation.    Exam:   BP 110/76 (Cuff Size: Large)   Pulse 92   Ht 5\' 3"  (1.6 m)   Wt 205 lb (93 kg)   LMP 03/15/1998 (Approximate)   SpO2 98%   BMI 36.31 kg/m     General appearance: alert, cooperative and appears stated age Head: normocephalic, without obvious abnormality, atraumatic Neck: no adenopathy, supple, symmetrical, trachea midline and thyroid normal to inspection and palpation Lungs: clear to auscultation bilaterally Breasts: normal appearance, no masses or tenderness, No nipple retraction or dimpling, No nipple discharge or bleeding, No axillary adenopathy Heart: regular rate and rhythm Abdomen: soft, non-tender; no masses, no organomegaly Extremities: extremities normal, atraumatic, no cyanosis or edema Skin: skin  color, texture, turgor normal. No rashes or lesions Lymph nodes: cervical, supraclavicular, and axillary nodes normal. Neurologic: grossly normal  Pelvic: External genitalia:  no lesions              No abnormal inguinal nodes palpated.              Urethra:  normal appearing urethra with no masses, tenderness or lesions              Bartholins and Skenes: normal                 Vagina: normal appearing vagina with normal color and discharge, no lesions              Cervix: absent              Pap taken: no Bimanual Exam:  Uterus:  absent              Adnexa: no mass, fullness, tenderness              Rectal exam: yes.  Confirms.              Anus:  normal sphincter tone, no lesions  Chaperone was present for exam.  Assessment:   Well woman visit with normal exam. Status post TAH with LSO for endometriosis. Menopausal symptoms.  Hypertension. Carotid artery stenosis.  Colon cancer screening.   Plan: Mammogram screening discussed.  She will update. Self breast awareness reviewed. Pap not indicated.  Guidelines for Calcium, Vitamin D, regular exercise program including cardiovascular and weight bearing exercise. We discussed options for treating menopausal symptoms.  Estrogen is not a good choice given her carotid artery stenosis.  Start Paxil 10 mg daily to treat menopausal symptoms and stress.  Potential side effects discussed.  Fu in 6 weeks for a recheck.  Will check FSH and estradiol then. Cologuard can be done again at the end of this year.  Follow up annually and prn.

## 2020-08-03 ENCOUNTER — Encounter: Payer: Self-pay | Admitting: Obstetrics and Gynecology

## 2020-08-03 ENCOUNTER — Other Ambulatory Visit: Payer: Self-pay

## 2020-08-03 ENCOUNTER — Ambulatory Visit (INDEPENDENT_AMBULATORY_CARE_PROVIDER_SITE_OTHER): Payer: BC Managed Care – PPO | Admitting: Obstetrics and Gynecology

## 2020-08-03 VITALS — BP 110/76 | HR 92 | Ht 63.0 in | Wt 205.0 lb

## 2020-08-03 DIAGNOSIS — N951 Menopausal and female climacteric states: Secondary | ICD-10-CM

## 2020-08-03 DIAGNOSIS — Z01419 Encounter for gynecological examination (general) (routine) without abnormal findings: Secondary | ICD-10-CM

## 2020-08-03 MED ORDER — PAROXETINE HCL 10 MG PO TABS: 10.0000 mg | ORAL_TABLET | ORAL | 1 refills | Status: DC

## 2020-08-03 NOTE — Patient Instructions (Signed)

## 2020-08-04 ENCOUNTER — Encounter: Payer: Self-pay | Admitting: Obstetrics and Gynecology

## 2020-08-27 ENCOUNTER — Other Ambulatory Visit: Payer: Self-pay | Admitting: Obstetrics and Gynecology

## 2020-08-29 NOTE — Telephone Encounter (Signed)
Pharmacy attached note to Rx "REQUEST FOR 90 DAYS PRESCRIPTION."

## 2020-09-19 NOTE — Progress Notes (Signed)
GYNECOLOGY  VISIT   HPI: 55 y.o.   Married  Caucasian  female   G2P2002 with Patient's last menstrual period was 03/15/1998 (approximate).   here for medication check. Doing great with Paxil and taking it in the am.   She started Paxil for menopausal symptoms of hot flashes and irritability.  Has been experiencing stress.  Feels the Paxil is life changing.  The the sweats and irritability are now controlled. She feels like herself.  Dealing with things better.  Wishes she would have tried medication years ago.  Has some dry mouth and nausea.  The nausea comes in waves.  Nausea is improving.  Asking about extended release.   Was too sleepy when she took Lexapro in the past.   Has carotid artery stenosis.   GYNECOLOGIC HISTORY: Patient's last menstrual period was 03/15/1998 (approximate). Contraception:  Hyst Menopausal hormone therapy: None Last mammogram: 11-07-18 3D/Neg/BiRads1 Last pap smear:  11-07-15 Neg:Neg HR HPV        OB History     Gravida  2   Para  2   Term  2   Preterm  0   AB  0   Living  2      SAB  0   IAB  0   Ectopic  0   Multiple  0   Live Births  2              Patient Active Problem List   Diagnosis Date Noted   Endometriosis 12/25/2012    Past Medical History:  Diagnosis Date   Carotid artery stenosis 03/30/2020   MRA IMAGING   Depression    Endometriosis    Gastritis    related to NSAID use   Hyperlipidemia 07/13/2020    Past Surgical History:  Procedure Laterality Date   ABDOMINAL HYSTERECTOMY  03/1998   LSO,RT. Cystectomy   CESAREAN SECTION  1990, 1993   x 2   REDUCTION MAMMAPLASTY     breast lift   TONSILLECTOMY      Current Outpatient Medications  Medication Sig Dispense Refill   BIOTIN PO Take by mouth.     celecoxib (CELEBREX) 200 MG capsule Take 200 mg by mouth daily.     Cholecalciferol (VITAMIN D3) 125 MCG (5000 UT) CAPS See admin instructions.     Cranberry-Vitamin C-Vitamin E (CRANBERRY PLUS  VITAMIN C) 4200-20-3 MG-MG-UNIT CAPS See admin instructions.     cyclobenzaprine (FLEXERIL) 10 MG tablet Take 10 mg by mouth 3 (three) times daily as needed for muscle spasms. Neck and shoulder pain     Eszopiclone 3 MG TABS Take 3 mg by mouth at bedtime as needed.     hydrOXYzine (ATARAX/VISTARIL) 25 MG tablet Take 25 mg by mouth every 6 (six) hours as needed for anxiety or itching.   2   losartan (COZAAR) 100 MG tablet Take 100 mg by mouth daily.     PARoxetine (PAXIL) 10 MG tablet TAKE 1 TABLET (10 MG TOTAL) BY MOUTH EVERY MORNING. 90 tablet 0   rosuvastatin (CRESTOR) 10 MG tablet Take 10 mg by mouth at bedtime.     temazepam (RESTORIL) 30 MG capsule Take 30 mg by mouth at bedtime.      traMADol (ULTRAM) 50 MG tablet Take 50 mg by mouth at bedtime.      UNABLE TO FIND viviscal     No current facility-administered medications for this visit.     ALLERGIES: Amoxicillin-pot clavulanate, Bupropion, Celecoxib, Doxycycline hyclate, Hydrocodone, Macrobid [nitrofurantoin  macrocrystal], Nitrofurantoin, Sulfa antibiotics, and Tramadol hcl  Family History  Problem Relation Age of Onset   Hypertension Mother    Diabetes Mother    Osteoporosis Mother    Cancer Maternal Grandfather    Breast cancer Maternal Aunt        34ish and 37ish   Allergic rhinitis Neg Hx    Angioedema Neg Hx    Asthma Neg Hx    Eczema Neg Hx     Social History   Socioeconomic History   Marital status: Married    Spouse name: Not on file   Number of children: Not on file   Years of education: Not on file   Highest education level: Not on file  Occupational History   Not on file  Tobacco Use   Smoking status: Never   Smokeless tobacco: Never  Vaping Use   Vaping Use: Never used  Substance and Sexual Activity   Alcohol use: No    Alcohol/week: 0.0 standard drinks   Drug use: No   Sexual activity: Yes    Partners: Male    Birth control/protection: Surgical    Comment: Hysterectomy  Other Topics Concern    Not on file  Social History Narrative   Not on file   Social Determinants of Health   Financial Resource Strain: Not on file  Food Insecurity: Not on file  Transportation Needs: Not on file  Physical Activity: Not on file  Stress: Not on file  Social Connections: Not on file  Intimate Partner Violence: Not on file    Review of Systems  All other systems reviewed and are negative.  PHYSICAL EXAMINATION:    BP 136/80   Pulse 97   Ht 5\' 3"  (1.6 m)   Wt 205 lb (93 kg)   LMP 03/15/1998 (Approximate)   SpO2 99%   BMI 36.31 kg/m     General appearance: alert, cooperative and appears stated age  ASSESSMENT  Menopausal symptoms.  Status post hysterectomy.  Medication monitoring.  PLAN  Try to take Paxil at dinner time.  She will let me know if she wants refills of Paxil 10 mg or wants to switch to Paxil CR 12.5 mg daily.  The latter is not on her formulary.  Check FSH and estradiol. Fu prn.    An After Visit Summary was printed and given to the patient.

## 2020-09-20 ENCOUNTER — Other Ambulatory Visit: Payer: Self-pay

## 2020-09-20 ENCOUNTER — Encounter: Payer: Self-pay | Admitting: Obstetrics and Gynecology

## 2020-09-20 ENCOUNTER — Ambulatory Visit: Payer: BC Managed Care – PPO | Admitting: Obstetrics and Gynecology

## 2020-09-20 VITALS — BP 136/80 | HR 97 | Ht 63.0 in | Wt 205.0 lb

## 2020-09-20 DIAGNOSIS — N951 Menopausal and female climacteric states: Secondary | ICD-10-CM

## 2020-09-20 DIAGNOSIS — Z5181 Encounter for therapeutic drug level monitoring: Secondary | ICD-10-CM

## 2020-09-21 LAB — ESTRADIOL: Estradiol: 18 pg/mL

## 2020-09-21 LAB — FOLLICLE STIMULATING HORMONE: FSH: 66.9 m[IU]/mL

## 2020-09-22 ENCOUNTER — Encounter: Payer: Self-pay | Admitting: Obstetrics and Gynecology

## 2020-11-23 ENCOUNTER — Other Ambulatory Visit: Payer: Self-pay

## 2020-11-23 MED ORDER — PAROXETINE HCL 10 MG PO TABS: 10.0000 mg | ORAL_TABLET | ORAL | 2 refills | Status: DC

## 2020-11-23 NOTE — Telephone Encounter (Signed)
AEX 08/03/20

## 2020-12-16 IMAGING — CR DG LUMBAR SPINE COMPLETE 4+V
5 series · 5 of 5 positions shown · non-contrast
Comparison: 08/28/2016.

CLINICAL DATA: Bilateral low back pain radiating to the knees for
the past year, left worse than right. No known injury.

EXAM:
LUMBAR SPINE - COMPLETE 4+ VIEW

[t lumbar spine ap]
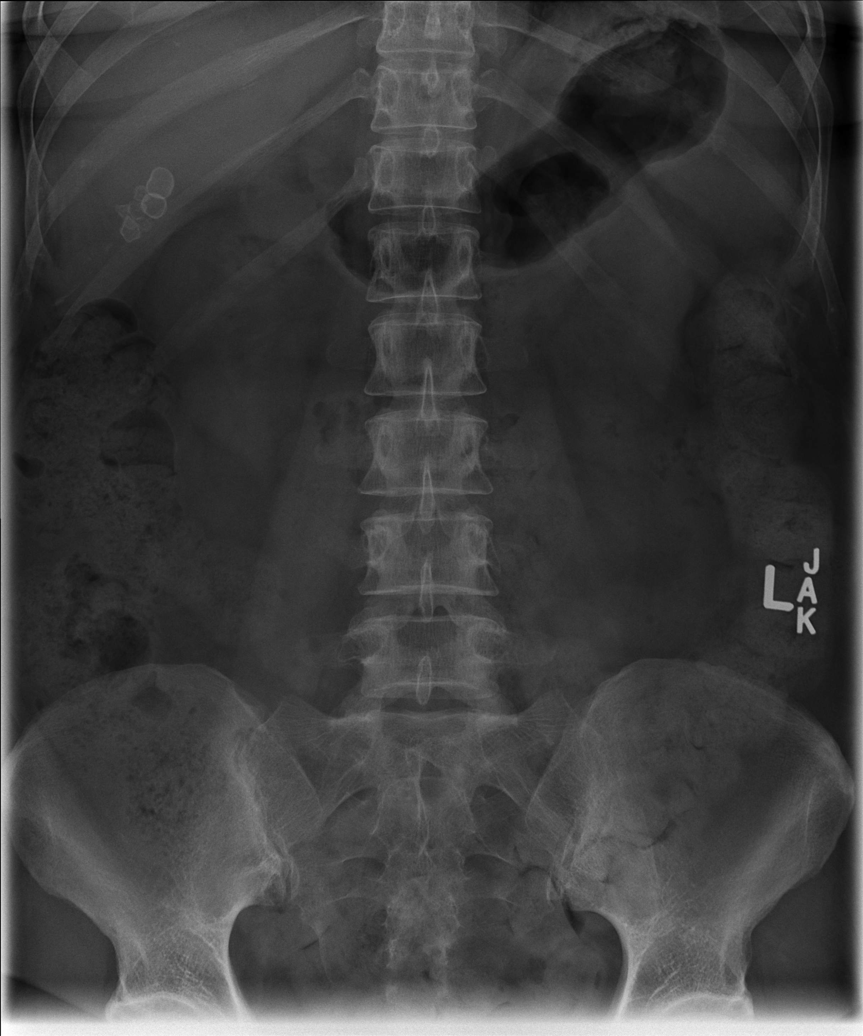

[t lumbar spine obl (1 of 2)]
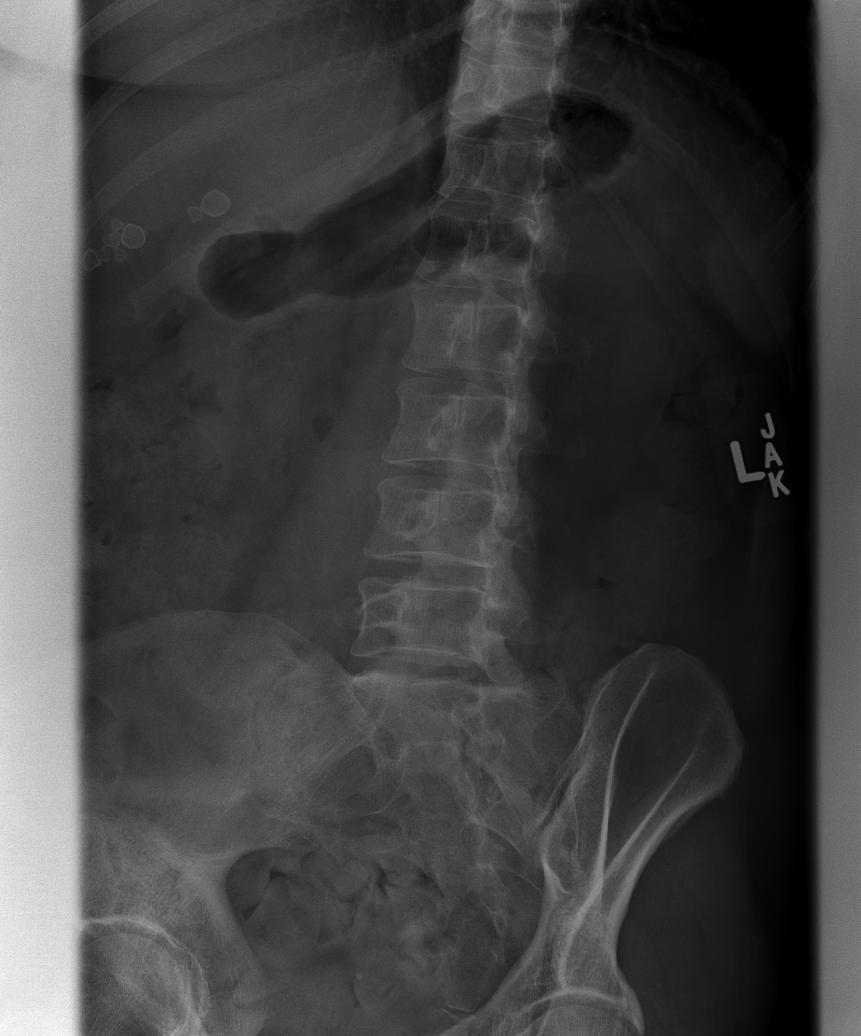

[t lumbar spine obl (2 of 2)]
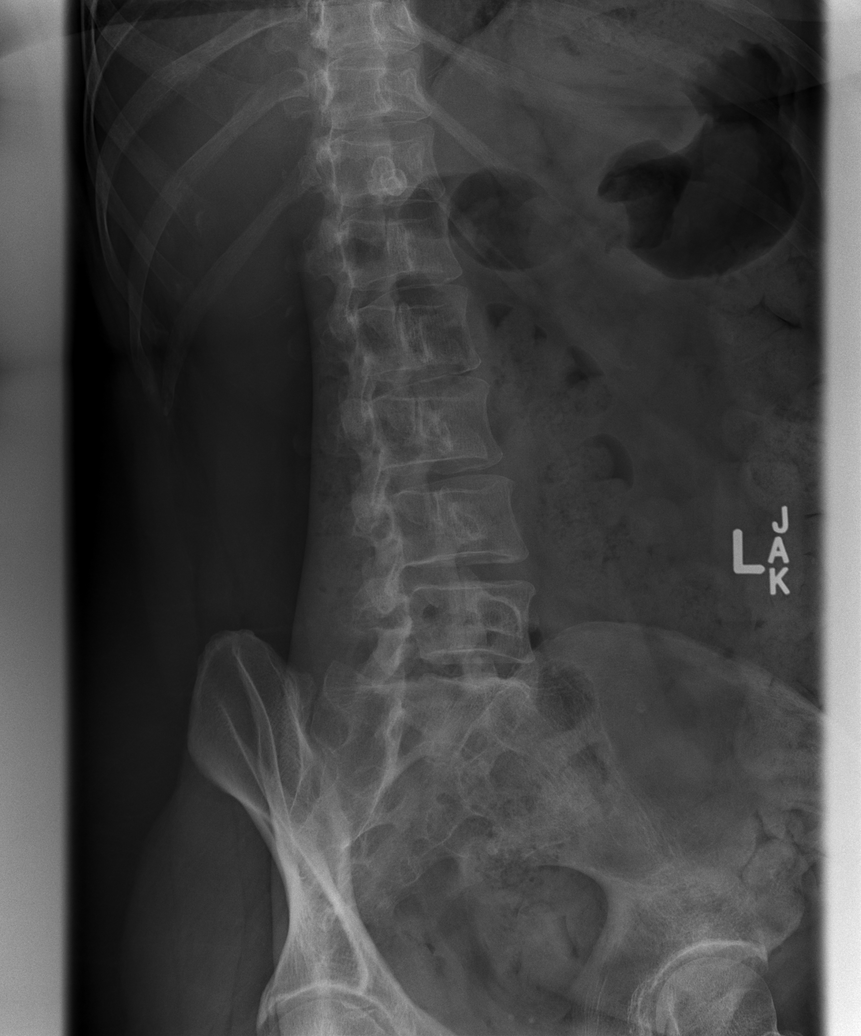

[t lumbar spine lat]
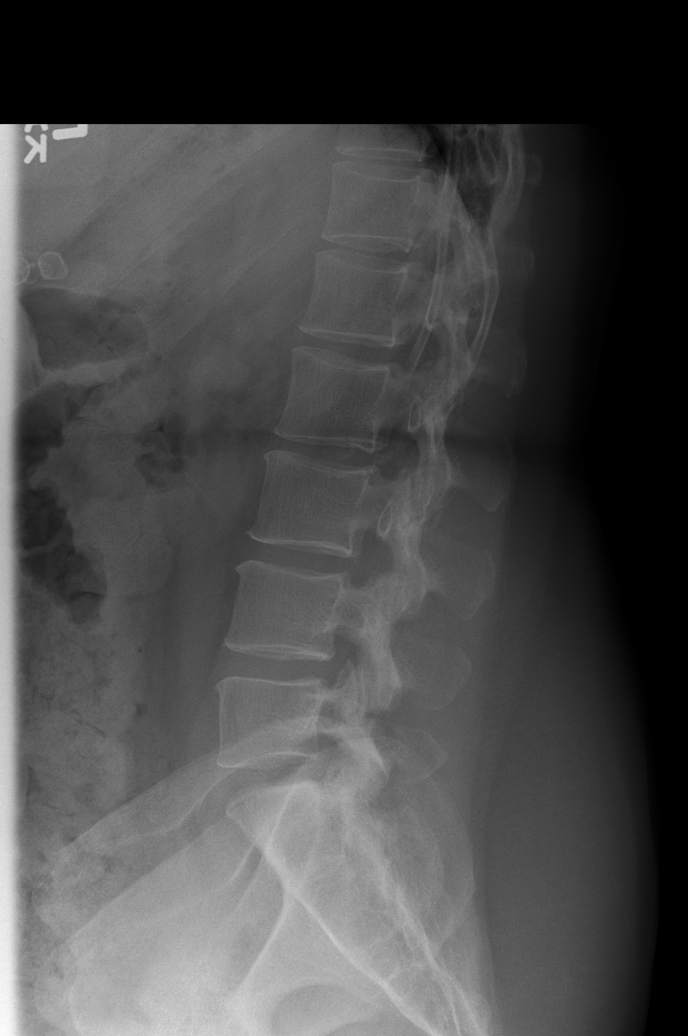

[t lumbar l-5 s-1 spot]
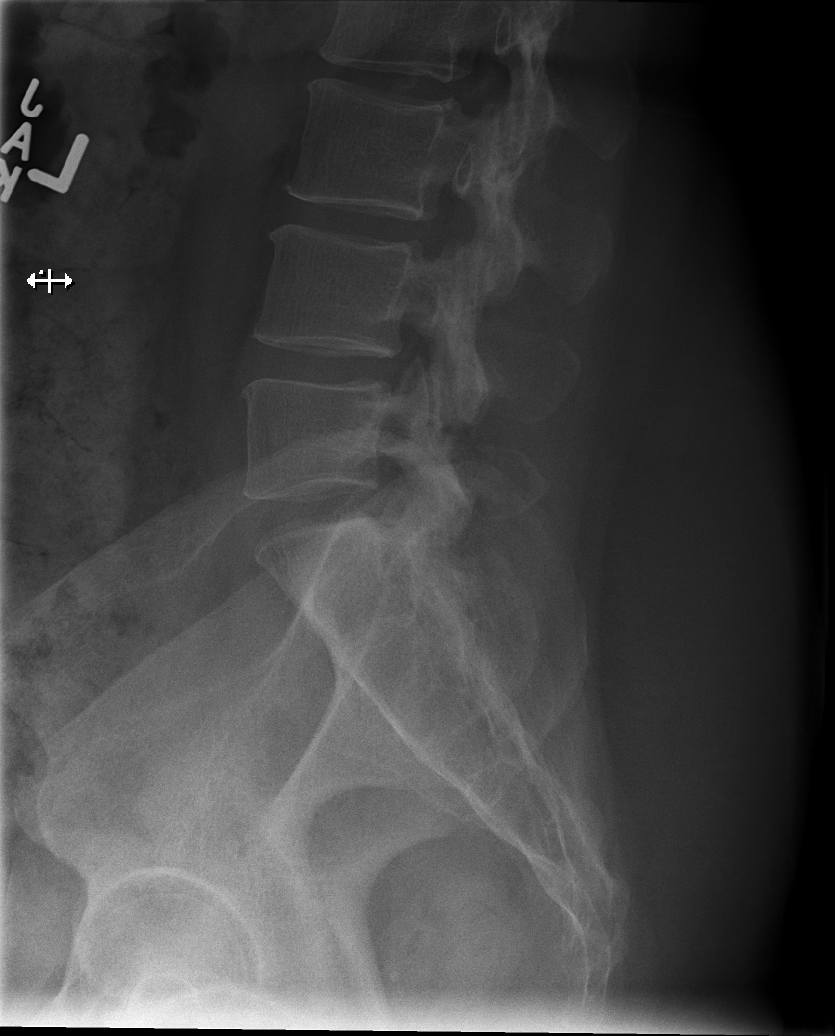

[5 of 5 positions shown; findings below may reference images not displayed]

FINDINGS: Five non-rib-bearing lumbar vertebrae. Straightening of the normal
lumbar lordosis, without significant change. Mild anterior spur
formation at multiple levels. No fractures, pars defects or
subluxations. Previously noted multiple calcified gallstones in the
gallbladder, the largest measuring 1.1 cm. Stool throughout the
colon.
IMPRESSION: 1. Mild degenerative changes.
2. Cholelithiasis.

## 2021-02-20 DIAGNOSIS — E7800 Pure hypercholesterolemia, unspecified: Secondary | ICD-10-CM | POA: Diagnosis not present

## 2021-02-20 DIAGNOSIS — I10 Essential (primary) hypertension: Secondary | ICD-10-CM | POA: Diagnosis not present

## 2021-02-20 DIAGNOSIS — E119 Type 2 diabetes mellitus without complications: Secondary | ICD-10-CM | POA: Diagnosis not present

## 2021-02-20 DIAGNOSIS — N390 Urinary tract infection, site not specified: Secondary | ICD-10-CM | POA: Diagnosis not present

## 2021-06-08 DIAGNOSIS — I10 Essential (primary) hypertension: Secondary | ICD-10-CM | POA: Diagnosis not present

## 2021-06-08 DIAGNOSIS — E119 Type 2 diabetes mellitus without complications: Secondary | ICD-10-CM | POA: Diagnosis not present

## 2021-06-08 DIAGNOSIS — E7800 Pure hypercholesterolemia, unspecified: Secondary | ICD-10-CM | POA: Diagnosis not present

## 2021-06-12 DIAGNOSIS — D3131 Benign neoplasm of right choroid: Secondary | ICD-10-CM | POA: Diagnosis not present

## 2021-06-12 DIAGNOSIS — H52223 Regular astigmatism, bilateral: Secondary | ICD-10-CM | POA: Diagnosis not present

## 2021-06-12 DIAGNOSIS — E119 Type 2 diabetes mellitus without complications: Secondary | ICD-10-CM | POA: Diagnosis not present

## 2021-06-19 ENCOUNTER — Other Ambulatory Visit: Payer: Self-pay

## 2021-06-19 NOTE — Telephone Encounter (Signed)
Last AEX 08/03/20--scheduled for 08/09/21.  ? ? ?Per pharmacy--pt's last refill of #90 was on 05/30/21. Refused due to having enough until annual exam on 08/09/21.  ?

## 2021-08-02 ENCOUNTER — Other Ambulatory Visit: Payer: Self-pay

## 2021-08-02 NOTE — Telephone Encounter (Signed)
Last AEX 08/03/20--scheduled for 08/09/21.      Per pharmacy--pt's last refill of #90 was on 05/30/21. Refused due to having enough until annual exam on 08/09/21. Will refill then if appropriate.

## 2021-08-07 NOTE — Progress Notes (Signed)
56 y.o. G15P2002 Married Caucasian female here for annual exam.    New dx for type II diabetes.  Taking metformin and reduced her A1C.   Struggling with weight.   Taking Paxil and it helps sweats and irritation.  Feels good on this.   Can fall asleep but does not stay asleep.  Takes meds to fall asleep.  Did a sleep study that was inconclusive.   PCP:  Venia Minks, MD   Patient's last menstrual period was 03/15/1998 (approximate).           Sexually active: Yes.    The current method of family planning is status post hysterectomy.    Exercising: No.  The patient does not participate in regular exercise at present. Smoker:  no  Health Maintenance: Pap:   11-07-15 Neg:Neg HR HPV History of abnormal Pap:  no MMG:  11-07-18 Neg/BiRads1--Info given to patient to schedule Colonoscopy:   NEVER--had neg.cologuard, 12/2017.  BMD:   n/a  Result  na TDaP:  2019 Gardasil:   n/a HIV:no Hep C:no Screening Labs: PCP   reports that she has never smoked. She has never used smokeless tobacco. She reports that she does not drink alcohol and does not use drugs.  Past Medical History:  Diagnosis Date   Carotid artery stenosis 03/30/2020   MRA IMAGING   Depression    Diabetes mellitus without complication (HCC)    Endometriosis    Gastritis    related to NSAID use   Hyperlipidemia 07/13/2020    Past Surgical History:  Procedure Laterality Date   ABDOMINAL HYSTERECTOMY  03/1998   LSO,RT. Cystectomy   CESAREAN SECTION  1990, 1993   x 2   REDUCTION MAMMAPLASTY     breast lift   TONSILLECTOMY      Current Outpatient Medications  Medication Sig Dispense Refill   celecoxib (CELEBREX) 200 MG capsule Take 200 mg by mouth daily.     cyclobenzaprine (FLEXERIL) 10 MG tablet Take 10 mg by mouth 3 (three) times daily as needed for muscle spasms. Neck and shoulder pain     Eszopiclone 3 MG TABS Take 3 mg by mouth at bedtime as needed.     hydrOXYzine (ATARAX/VISTARIL) 25 MG tablet Take  25 mg by mouth every 6 (six) hours as needed for anxiety or itching.   2   losartan (COZAAR) 100 MG tablet Take 100 mg by mouth daily.     metFORMIN (GLUCOPHAGE) 500 MG tablet TAKE 1 TABLET BY MOUTH EVERY DAY WITH A MEAL FOR 30 DAYS     PARoxetine (PAXIL) 10 MG tablet 1 tablet in the morning     rosuvastatin (CRESTOR) 10 MG tablet Take 10 mg by mouth at bedtime.     temazepam (RESTORIL) 30 MG capsule 1 capsule at bedtime as needed     traMADol (ULTRAM) 50 MG tablet Take 50 mg by mouth at bedtime.      No current facility-administered medications for this visit.    Family History  Problem Relation Age of Onset   Hypertension Mother    Diabetes Mother    Osteoporosis Mother    Heart attack Mother    Breast cancer Maternal Aunt        4ish and 41ish   Cancer Maternal Grandfather    Allergic rhinitis Neg Hx    Angioedema Neg Hx    Asthma Neg Hx    Eczema Neg Hx     Review of Systems  All other systems reviewed and are  negative.   Exam:   BP 120/82   Pulse 97   Ht 5' 3.25" (1.607 m)   Wt 211 lb (95.7 kg)   LMP 03/15/1998 (Approximate)   SpO2 98%   BMI 37.08 kg/m     General appearance: alert, cooperative and appears stated age Head: normocephalic, without obvious abnormality, atraumatic Neck: no adenopathy, supple, symmetrical, trachea midline and thyroid normal to inspection and palpation Lungs: clear to auscultation bilaterally Breasts: normal appearance, no masses or tenderness, No nipple retraction or dimpling, No nipple discharge or bleeding, No axillary adenopathy Heart: regular rate and rhythm Abdomen: soft, non-tender; no masses, no organomegaly Extremities: extremities normal, atraumatic, no cyanosis or edema Skin: skin color, texture, turgor normal. No rashes or lesions Lymph nodes: cervical, supraclavicular, and axillary nodes normal. Neurologic: grossly normal  Pelvic: External genitalia:  no lesions              No abnormal inguinal nodes palpated.               Urethra:  normal appearing urethra with no masses, tenderness or lesions              Bartholins and Skenes: normal                 Vagina: normal appearing vagina with normal color and discharge, no lesions              Cervix: absent              Pap taken: no Bimanual Exam:  Uterus: absent              Adnexa: no mass, fullness, tenderness              Rectal exam: yes.  Confirms.              Anus:  normal sphincter tone, no lesions  Chaperone was present for exam:  Marchelle Folks, CMA  Assessment:   Well woman visit with gynecologic exam. Status post TAH with LSO for endometriosis. Menopausal symptoms. Controlled on Paxil. Hypertension. Carotid artery stenosis.  Taking sedating medications.  Colon cancer screening.   Plan: Mammogram screening discussed.  She will schedule.  Self breast awareness reviewed. Pap and HR HPV not indicated.  Guidelines for Calcium, Vitamin D, regular exercise program including cardiovascular and weight bearing exercise. We discussed her medications that have sedating effect, including Paxil.   She wishes to continue Paxil.  Rx for 10 mg q am, #90, RF 3.  Cologuard ordered.  Follow up annually and prn.   After visit summary provided.

## 2021-08-09 ENCOUNTER — Ambulatory Visit (INDEPENDENT_AMBULATORY_CARE_PROVIDER_SITE_OTHER): Payer: BC Managed Care – PPO | Admitting: Obstetrics and Gynecology

## 2021-08-09 ENCOUNTER — Encounter: Payer: Self-pay | Admitting: Obstetrics and Gynecology

## 2021-08-09 VITALS — BP 120/82 | HR 97 | Ht 63.25 in | Wt 211.0 lb

## 2021-08-09 DIAGNOSIS — Z01419 Encounter for gynecological examination (general) (routine) without abnormal findings: Secondary | ICD-10-CM

## 2021-08-09 DIAGNOSIS — Z1211 Encounter for screening for malignant neoplasm of colon: Secondary | ICD-10-CM | POA: Diagnosis not present

## 2021-08-09 MED ORDER — PAROXETINE HCL 10 MG PO TABS: ORAL_TABLET | ORAL | 3 refills | Status: AC

## 2021-08-09 NOTE — Patient Instructions (Signed)

## 2021-08-28 DIAGNOSIS — Z1211 Encounter for screening for malignant neoplasm of colon: Secondary | ICD-10-CM | POA: Diagnosis not present

## 2021-09-05 LAB — COLOGUARD: COLOGUARD: NEGATIVE

## 2021-09-26 DIAGNOSIS — R8290 Unspecified abnormal findings in urine: Secondary | ICD-10-CM | POA: Diagnosis not present

## 2021-10-23 DIAGNOSIS — R399 Unspecified symptoms and signs involving the genitourinary system: Secondary | ICD-10-CM | POA: Diagnosis not present

## 2021-10-23 DIAGNOSIS — E118 Type 2 diabetes mellitus with unspecified complications: Secondary | ICD-10-CM | POA: Diagnosis not present

## 2021-10-23 DIAGNOSIS — I10 Essential (primary) hypertension: Secondary | ICD-10-CM | POA: Diagnosis not present

## 2021-10-23 DIAGNOSIS — R32 Unspecified urinary incontinence: Secondary | ICD-10-CM | POA: Diagnosis not present

## 2021-11-28 DIAGNOSIS — E119 Type 2 diabetes mellitus without complications: Secondary | ICD-10-CM | POA: Diagnosis not present

## 2021-11-28 DIAGNOSIS — R32 Unspecified urinary incontinence: Secondary | ICD-10-CM | POA: Diagnosis not present

## 2021-11-28 DIAGNOSIS — I10 Essential (primary) hypertension: Secondary | ICD-10-CM | POA: Diagnosis not present

## 2021-11-28 DIAGNOSIS — E7800 Pure hypercholesterolemia, unspecified: Secondary | ICD-10-CM | POA: Diagnosis not present

## 2022-01-11 DIAGNOSIS — J40 Bronchitis, not specified as acute or chronic: Secondary | ICD-10-CM | POA: Diagnosis not present

## 2022-01-17 DIAGNOSIS — N3946 Mixed incontinence: Secondary | ICD-10-CM | POA: Diagnosis not present

## 2022-01-17 DIAGNOSIS — R82998 Other abnormal findings in urine: Secondary | ICD-10-CM | POA: Diagnosis not present

## 2022-01-17 DIAGNOSIS — R8271 Bacteriuria: Secondary | ICD-10-CM | POA: Diagnosis not present

## 2022-03-02 DIAGNOSIS — R8271 Bacteriuria: Secondary | ICD-10-CM | POA: Diagnosis not present

## 2022-03-02 DIAGNOSIS — N3946 Mixed incontinence: Secondary | ICD-10-CM | POA: Diagnosis not present

## 2022-03-02 DIAGNOSIS — R8279 Other abnormal findings on microbiological examination of urine: Secondary | ICD-10-CM | POA: Diagnosis not present

## 2022-04-24 DIAGNOSIS — I10 Essential (primary) hypertension: Secondary | ICD-10-CM | POA: Diagnosis not present

## 2022-04-24 DIAGNOSIS — E669 Obesity, unspecified: Secondary | ICD-10-CM | POA: Diagnosis not present

## 2022-04-24 DIAGNOSIS — E7800 Pure hypercholesterolemia, unspecified: Secondary | ICD-10-CM | POA: Diagnosis not present

## 2022-04-24 DIAGNOSIS — I672 Cerebral atherosclerosis: Secondary | ICD-10-CM | POA: Diagnosis not present

## 2022-04-24 DIAGNOSIS — E118 Type 2 diabetes mellitus with unspecified complications: Secondary | ICD-10-CM | POA: Diagnosis not present

## 2022-06-01 DIAGNOSIS — Z20822 Contact with and (suspected) exposure to covid-19: Secondary | ICD-10-CM | POA: Diagnosis not present

## 2022-06-01 DIAGNOSIS — R059 Cough, unspecified: Secondary | ICD-10-CM | POA: Diagnosis not present

## 2022-06-06 DIAGNOSIS — Z20822 Contact with and (suspected) exposure to covid-19: Secondary | ICD-10-CM | POA: Diagnosis not present

## 2022-06-06 DIAGNOSIS — R059 Cough, unspecified: Secondary | ICD-10-CM | POA: Diagnosis not present

## 2022-06-19 DIAGNOSIS — E119 Type 2 diabetes mellitus without complications: Secondary | ICD-10-CM | POA: Diagnosis not present

## 2022-06-19 DIAGNOSIS — I10 Essential (primary) hypertension: Secondary | ICD-10-CM | POA: Diagnosis not present

## 2022-06-19 DIAGNOSIS — E7800 Pure hypercholesterolemia, unspecified: Secondary | ICD-10-CM | POA: Diagnosis not present

## 2022-06-19 DIAGNOSIS — G8929 Other chronic pain: Secondary | ICD-10-CM | POA: Diagnosis not present

## 2022-07-02 DIAGNOSIS — E669 Obesity, unspecified: Secondary | ICD-10-CM | POA: Diagnosis not present

## 2022-07-02 DIAGNOSIS — L729 Follicular cyst of the skin and subcutaneous tissue, unspecified: Secondary | ICD-10-CM | POA: Diagnosis not present

## 2022-07-02 DIAGNOSIS — L918 Other hypertrophic disorders of the skin: Secondary | ICD-10-CM | POA: Diagnosis not present

## 2022-07-02 DIAGNOSIS — E118 Type 2 diabetes mellitus with unspecified complications: Secondary | ICD-10-CM | POA: Diagnosis not present

## 2022-08-15 ENCOUNTER — Ambulatory Visit: Payer: BC Managed Care – PPO | Admitting: Obstetrics and Gynecology

## 2022-10-23 DIAGNOSIS — G8929 Other chronic pain: Secondary | ICD-10-CM | POA: Diagnosis not present

## 2022-10-23 DIAGNOSIS — E119 Type 2 diabetes mellitus without complications: Secondary | ICD-10-CM | POA: Diagnosis not present

## 2022-10-23 DIAGNOSIS — I10 Essential (primary) hypertension: Secondary | ICD-10-CM | POA: Diagnosis not present

## 2022-10-23 DIAGNOSIS — E7800 Pure hypercholesterolemia, unspecified: Secondary | ICD-10-CM | POA: Diagnosis not present

## 2022-11-28 NOTE — Progress Notes (Signed)
57 y.o. G17P2002 Married Caucasian female here for annual exam.  Pt wants to discuss endometriosis.  Has tailbone pain across her lower spine for 5 years.  It is random.  Some arthritic changes.   Had endometriosis of the vagina in 2019 noted with biopsy.   FSH 66.9 on 09/20/20.   She is noting urinary odor since June, 2023.  Saw urology.   Negative for infection.  Placed on a probiotic.   Her PCP is prescribing her Paxil now.   Goal to schedule her mammogram.   Mother in assisted living.  Cares for grandchildren.   PCP: Noberto Retort, MD   Patient's last menstrual period was 03/15/1998 (approximate).           Sexually active: Yes.    The current method of family planning is status post hysterectomy.    Exercising: No.   Smoker:  no  OB History  Gravida Para Term Preterm AB Living  2 2 2  0 0 2  SAB IAB Ectopic Multiple Live Births  0 0 0 0 2    # Outcome Date GA Lbr Len/2nd Weight Sex Type Anes PTL Lv  2 Term 07/23/91 [redacted]w[redacted]d  9 lb 9 oz (4.338 kg) M CS-Unspec  N LIV  1 Term 06/09/88 [redacted]w[redacted]d  10 lb 2 oz (4.593 kg) F CS-Unspec EPI, Spinal N LIV     Health Maintenance: Pap:  11-07-15 Neg:Neg HR HPV  History of abnormal Pap:  no MMG: 11/07/18 Breast Density Cat B, BI-RADS CAT 1 neg Colonoscopy:  cologuard 2023 - negative HM Colonoscopy          Overdue - Colonoscopy (Every 10 Years) Never done    No completion history exists for this topic.           BMD:  n/a  Result  n/a  HIV: n/a Hep C: n/a  Immunization History  Administered Date(s) Administered   PFIZER(Purple Top)SARS-COV-2 Vaccination 05/08/2019, 06/03/2019   Tdap 04/12/2017    Flu and Covid vaccines discussed.    reports that she has never smoked. She has never used smokeless tobacco. She reports that she does not drink alcohol and does not use drugs.  Past Medical History:  Diagnosis Date   Carotid artery stenosis 03/30/2020   MRA IMAGING   Depression    Diabetes mellitus without  complication (HCC)    Endometriosis    Gastritis    related to NSAID use   Hyperlipidemia 07/13/2020    Past Surgical History:  Procedure Laterality Date   ABDOMINAL HYSTERECTOMY  03/1998   LSO,RT. Cystectomy   CESAREAN SECTION  1990, 1993   x 2   REDUCTION MAMMAPLASTY     breast lift   TONSILLECTOMY      Current Outpatient Medications  Medication Sig Dispense Refill   celecoxib (CELEBREX) 200 MG capsule Take 200 mg by mouth daily.     cyclobenzaprine (FLEXERIL) 10 MG tablet Take 10 mg by mouth 3 (three) times daily as needed for muscle spasms. Neck and shoulder pain     Eszopiclone 3 MG TABS Take 3 mg by mouth at bedtime as needed.     hydrOXYzine (ATARAX/VISTARIL) 25 MG tablet Take 25 mg by mouth every 6 (six) hours as needed for anxiety or itching.   2   losartan (COZAAR) 100 MG tablet Take 100 mg by mouth daily.     metFORMIN (GLUCOPHAGE) 500 MG tablet TAKE 1 TABLET BY MOUTH EVERY DAY WITH A MEAL FOR 30 DAYS  OZEMPIC, 2 MG/DOSE, 8 MG/3ML SOPN Inject into the skin.     PARoxetine (PAXIL) 10 MG tablet 1 tablet in the morning 90 tablet 3   rosuvastatin (CRESTOR) 10 MG tablet Take 10 mg by mouth at bedtime.     temazepam (RESTORIL) 30 MG capsule 1 capsule at bedtime as needed     traMADol (ULTRAM) 50 MG tablet Take 50 mg by mouth at bedtime.      No current facility-administered medications for this visit.    Family History  Problem Relation Age of Onset   Hypertension Mother    Diabetes Mother    Osteoporosis Mother    Heart attack Mother    Breast cancer Maternal Aunt        59ish and 80ish   Cancer Maternal Grandfather    Allergic rhinitis Neg Hx    Angioedema Neg Hx    Asthma Neg Hx    Eczema Neg Hx     Review of Systems  All other systems reviewed and are negative.   Exam:   BP 122/80 (BP Location: Left Arm, Patient Position: Sitting, Cuff Size: Normal)   Ht 5' 4.35" (1.634 m)   Wt 196 lb (88.9 kg)   LMP 03/15/1998 (Approximate)   BMI 33.28 kg/m      General appearance: alert, cooperative and appears stated age Head: normocephalic, without obvious abnormality, atraumatic Neck: no adenopathy, supple, symmetrical, trachea midline and thyroid normal to inspection and palpation Lungs: clear to auscultation bilaterally Breasts: normal appearance, no masses or tenderness, No nipple retraction or dimpling, No nipple discharge or bleeding, No axillary adenopathy Heart: regular rate and rhythm Abdomen: soft, non-tender; no masses, no organomegaly Extremities: extremities normal, atraumatic, no cyanosis or edema Skin: skin color, texture, turgor normal. No rashes or lesions Lymph nodes: cervical, supraclavicular, and axillary nodes normal. Neurologic: grossly normal  Pelvic: External genitalia:  no lesions              No abnormal inguinal nodes palpated.              Urethra:  normal appearing urethra with no masses, tenderness or lesions              Bartholins and Skenes: normal                 Vagina: normal appearing vagina with normal color and discharge, no lesions              Cervix: absent              Pap taken: no Bimanual Exam:  Uterus:  absent              Adnexa: no mass, fullness, tenderness              Rectal exam: yes.  Confirms.              Anus:  normal sphincter tone, no lesions  Chaperone was present for exam:  Warren Lacy, CMA   Assessment:  Well woman with GYN exam. Status post TAH with LSO and right ovarian cystectomy. for endometriosis. Hx vaginal biopsy in 2019 with lesion consistent with endometriosis.   Urinary odor.  Menopausal symptoms. Controlled on Paxil.  PCP prescribing.  Hypertension. DM. Carotid artery stenosis.   Plan: Mammogram screening recommended. Contact numbers for facilities to patient. Self breast awareness reviewed. Guidelines for Calcium, Vitamin D, regular exercise program including cardiovascular and weight bearing exercise. We discussed that endometriosis is a disease of  premenopausal women and that this would not be expected to be a cause of her back pain.  I offered a pelvic US to check for a cyst of her remaining ovary.  She declined.  Will check for vaginitis as a potential cause of malodor.  FU yearly and prn.

## 2022-12-12 ENCOUNTER — Encounter: Payer: Self-pay | Admitting: Obstetrics and Gynecology

## 2022-12-12 ENCOUNTER — Ambulatory Visit (INDEPENDENT_AMBULATORY_CARE_PROVIDER_SITE_OTHER): Payer: BC Managed Care – PPO | Admitting: Obstetrics and Gynecology

## 2022-12-12 ENCOUNTER — Other Ambulatory Visit (HOSPITAL_COMMUNITY)
Admission: RE | Admit: 2022-12-12 | Discharge: 2022-12-12 | Disposition: A | Discharge status: Home / Self Care | Payer: BC Managed Care – PPO | Source: Ambulatory Visit | Attending: Obstetrics and Gynecology | Admitting: Obstetrics and Gynecology

## 2022-12-12 VITALS — BP 122/80 | Ht 64.35 in | Wt 196.0 lb

## 2022-12-12 DIAGNOSIS — Z01419 Encounter for gynecological examination (general) (routine) without abnormal findings: Secondary | ICD-10-CM

## 2022-12-12 DIAGNOSIS — R8290 Unspecified abnormal findings in urine: Secondary | ICD-10-CM | POA: Diagnosis not present

## 2022-12-12 NOTE — Patient Instructions (Signed)

## 2022-12-13 LAB — CERVICOVAGINAL ANCILLARY ONLY
Bacterial Vaginitis (gardnerella): NEGATIVE
Candida Glabrata: NEGATIVE
Candida Vaginitis: NEGATIVE
Comment: NEGATIVE
Comment: NEGATIVE
Comment: NEGATIVE
Comment: NEGATIVE
Trichomonas: NEGATIVE

## 2022-12-18 DIAGNOSIS — E119 Type 2 diabetes mellitus without complications: Secondary | ICD-10-CM | POA: Diagnosis not present

## 2022-12-18 DIAGNOSIS — H524 Presbyopia: Secondary | ICD-10-CM | POA: Diagnosis not present

## 2022-12-21 DIAGNOSIS — M533 Sacrococcygeal disorders, not elsewhere classified: Secondary | ICD-10-CM | POA: Diagnosis not present

## 2022-12-21 DIAGNOSIS — M5442 Lumbago with sciatica, left side: Secondary | ICD-10-CM | POA: Diagnosis not present

## 2022-12-21 DIAGNOSIS — R202 Paresthesia of skin: Secondary | ICD-10-CM | POA: Diagnosis not present

## 2022-12-21 DIAGNOSIS — L239 Allergic contact dermatitis, unspecified cause: Secondary | ICD-10-CM | POA: Diagnosis not present

## 2022-12-21 DIAGNOSIS — M5441 Lumbago with sciatica, right side: Secondary | ICD-10-CM | POA: Diagnosis not present

## 2023-01-03 DIAGNOSIS — M533 Sacrococcygeal disorders, not elsewhere classified: Secondary | ICD-10-CM | POA: Diagnosis not present

## 2023-01-22 ENCOUNTER — Other Ambulatory Visit: Payer: Self-pay | Admitting: Obstetrics and Gynecology

## 2023-01-22 DIAGNOSIS — Z1231 Encounter for screening mammogram for malignant neoplasm of breast: Secondary | ICD-10-CM

## 2023-01-28 DIAGNOSIS — M545 Low back pain, unspecified: Secondary | ICD-10-CM | POA: Diagnosis not present

## 2023-01-28 DIAGNOSIS — M533 Sacrococcygeal disorders, not elsewhere classified: Secondary | ICD-10-CM | POA: Diagnosis not present

## 2023-01-31 DIAGNOSIS — M545 Low back pain, unspecified: Secondary | ICD-10-CM | POA: Diagnosis not present

## 2023-01-31 DIAGNOSIS — M533 Sacrococcygeal disorders, not elsewhere classified: Secondary | ICD-10-CM | POA: Diagnosis not present

## 2023-02-11 DIAGNOSIS — M533 Sacrococcygeal disorders, not elsewhere classified: Secondary | ICD-10-CM | POA: Diagnosis not present

## 2023-02-11 DIAGNOSIS — M545 Low back pain, unspecified: Secondary | ICD-10-CM | POA: Diagnosis not present

## 2023-02-14 DIAGNOSIS — M533 Sacrococcygeal disorders, not elsewhere classified: Secondary | ICD-10-CM | POA: Diagnosis not present

## 2023-02-14 DIAGNOSIS — M545 Low back pain, unspecified: Secondary | ICD-10-CM | POA: Diagnosis not present

## 2023-02-18 DIAGNOSIS — M533 Sacrococcygeal disorders, not elsewhere classified: Secondary | ICD-10-CM | POA: Diagnosis not present

## 2023-02-18 DIAGNOSIS — M545 Low back pain, unspecified: Secondary | ICD-10-CM | POA: Diagnosis not present

## 2023-02-19 DIAGNOSIS — M545 Low back pain, unspecified: Secondary | ICD-10-CM | POA: Diagnosis not present

## 2023-02-19 DIAGNOSIS — M533 Sacrococcygeal disorders, not elsewhere classified: Secondary | ICD-10-CM | POA: Diagnosis not present

## 2023-02-20 ENCOUNTER — Ambulatory Visit
Admission: RE | Admit: 2023-02-20 | Discharge: 2023-02-20 | Disposition: A | Discharge status: Home / Self Care | Payer: BC Managed Care – PPO | Source: Ambulatory Visit | Attending: Obstetrics and Gynecology | Admitting: Obstetrics and Gynecology

## 2023-02-20 DIAGNOSIS — Z1231 Encounter for screening mammogram for malignant neoplasm of breast: Secondary | ICD-10-CM

## 2023-02-21 DIAGNOSIS — M533 Sacrococcygeal disorders, not elsewhere classified: Secondary | ICD-10-CM | POA: Diagnosis not present

## 2023-02-21 DIAGNOSIS — M545 Low back pain, unspecified: Secondary | ICD-10-CM | POA: Diagnosis not present

## 2023-02-26 DIAGNOSIS — M533 Sacrococcygeal disorders, not elsewhere classified: Secondary | ICD-10-CM | POA: Diagnosis not present

## 2023-02-26 DIAGNOSIS — M545 Low back pain, unspecified: Secondary | ICD-10-CM | POA: Diagnosis not present

## 2023-03-01 DIAGNOSIS — M533 Sacrococcygeal disorders, not elsewhere classified: Secondary | ICD-10-CM | POA: Diagnosis not present

## 2023-03-01 DIAGNOSIS — M545 Low back pain, unspecified: Secondary | ICD-10-CM | POA: Diagnosis not present

## 2023-03-04 DIAGNOSIS — M545 Low back pain, unspecified: Secondary | ICD-10-CM | POA: Diagnosis not present

## 2023-03-04 DIAGNOSIS — M533 Sacrococcygeal disorders, not elsewhere classified: Secondary | ICD-10-CM | POA: Diagnosis not present

## 2023-03-07 DIAGNOSIS — M545 Low back pain, unspecified: Secondary | ICD-10-CM | POA: Diagnosis not present

## 2023-03-07 DIAGNOSIS — M533 Sacrococcygeal disorders, not elsewhere classified: Secondary | ICD-10-CM | POA: Diagnosis not present

## 2023-04-08 DIAGNOSIS — M545 Low back pain, unspecified: Secondary | ICD-10-CM | POA: Diagnosis not present

## 2023-04-23 DIAGNOSIS — E7800 Pure hypercholesterolemia, unspecified: Secondary | ICD-10-CM | POA: Diagnosis not present

## 2023-04-23 DIAGNOSIS — E119 Type 2 diabetes mellitus without complications: Secondary | ICD-10-CM | POA: Diagnosis not present

## 2023-04-23 DIAGNOSIS — I10 Essential (primary) hypertension: Secondary | ICD-10-CM | POA: Diagnosis not present

## 2023-04-23 DIAGNOSIS — G8929 Other chronic pain: Secondary | ICD-10-CM | POA: Diagnosis not present

## 2023-11-19 DIAGNOSIS — I10 Essential (primary) hypertension: Secondary | ICD-10-CM | POA: Diagnosis not present

## 2023-11-19 DIAGNOSIS — F331 Major depressive disorder, recurrent, moderate: Secondary | ICD-10-CM | POA: Diagnosis not present

## 2023-11-19 DIAGNOSIS — E119 Type 2 diabetes mellitus without complications: Secondary | ICD-10-CM | POA: Diagnosis not present

## 2023-11-19 DIAGNOSIS — E7800 Pure hypercholesterolemia, unspecified: Secondary | ICD-10-CM | POA: Diagnosis not present

## 2023-11-19 DIAGNOSIS — R5383 Other fatigue: Secondary | ICD-10-CM | POA: Diagnosis not present

## 2023-12-16 ENCOUNTER — Ambulatory Visit: Payer: BC Managed Care – PPO | Admitting: Obstetrics and Gynecology

## 2024-01-01 DIAGNOSIS — D3131 Benign neoplasm of right choroid: Secondary | ICD-10-CM | POA: Diagnosis not present

## 2024-01-01 DIAGNOSIS — E119 Type 2 diabetes mellitus without complications: Secondary | ICD-10-CM | POA: Diagnosis not present

## 2024-01-01 DIAGNOSIS — H2513 Age-related nuclear cataract, bilateral: Secondary | ICD-10-CM | POA: Diagnosis not present
